# Patient Record
Sex: Male | Born: 1965
Health system: Southern US, Community
[De-identification: ages and names within clinical notes are randomized; demographics above are authoritative.]

## PROBLEM LIST (undated history)

## (undated) DIAGNOSIS — G4733 Obstructive sleep apnea (adult) (pediatric): Secondary | ICD-10-CM

## (undated) DIAGNOSIS — I493 Ventricular premature depolarization: Secondary | ICD-10-CM

## (undated) DIAGNOSIS — I509 Heart failure, unspecified: Secondary | ICD-10-CM

## (undated) DIAGNOSIS — I2510 Atherosclerotic heart disease of native coronary artery without angina pectoris: Secondary | ICD-10-CM

## (undated) DIAGNOSIS — I428 Other cardiomyopathies: Secondary | ICD-10-CM

## (undated) DIAGNOSIS — Z720 Tobacco use: Secondary | ICD-10-CM

## (undated) HISTORY — DX: Other cardiomyopathies: I42.8

## (undated) HISTORY — DX: Ventricular premature depolarization: I49.3

## (undated) HISTORY — PX: APPENDECTOMY: SHX54

## (undated) HISTORY — DX: Atherosclerotic heart disease of native coronary artery without angina pectoris: I25.10

## (undated) HISTORY — DX: Obstructive sleep apnea (adult) (pediatric): G47.33

---

## 2003-11-03 ENCOUNTER — Ambulatory Visit: Admission: RE | Admit: 2003-11-03 | Discharge: 2003-11-03 | Payer: Self-pay | Admitting: Otolaryngology

## 2005-09-14 ENCOUNTER — Emergency Department (HOSPITAL_COMMUNITY): Admission: EM | Admit: 2005-09-14 | Discharge: 2005-09-14 | Payer: Self-pay | Admitting: Emergency Medicine

## 2005-09-18 ENCOUNTER — Ambulatory Visit: Payer: Self-pay | Admitting: Orthopedic Surgery

## 2005-09-19 ENCOUNTER — Ambulatory Visit (HOSPITAL_COMMUNITY): Payer: Self-pay | Admitting: Orthopedic Surgery

## 2005-09-19 ENCOUNTER — Encounter: Admission: RE | Admit: 2005-09-19 | Discharge: 2005-09-19 | Payer: Self-pay | Admitting: Orthopedic Surgery

## 2005-09-19 ENCOUNTER — Encounter (HOSPITAL_COMMUNITY): Admission: RE | Admit: 2005-09-19 | Discharge: 2005-10-19 | Payer: Self-pay | Admitting: Orthopedic Surgery

## 2005-09-25 ENCOUNTER — Ambulatory Visit: Payer: Self-pay | Admitting: Orthopedic Surgery

## 2005-10-02 ENCOUNTER — Ambulatory Visit: Payer: Self-pay | Admitting: Orthopedic Surgery

## 2006-05-27 ENCOUNTER — Encounter: Admission: RE | Admit: 2006-05-27 | Discharge: 2006-05-27 | Payer: Self-pay | Admitting: Otolaryngology

## 2012-12-30 ENCOUNTER — Other Ambulatory Visit (HOSPITAL_COMMUNITY): Payer: Self-pay | Admitting: Internal Medicine

## 2012-12-30 ENCOUNTER — Ambulatory Visit (HOSPITAL_COMMUNITY)
Admission: RE | Admit: 2012-12-30 | Discharge: 2012-12-30 | Disposition: A | Discharge status: Home / Self Care | Payer: BC Managed Care – PPO | Source: Ambulatory Visit | Attending: Internal Medicine | Admitting: Internal Medicine

## 2012-12-30 DIAGNOSIS — F17200 Nicotine dependence, unspecified, uncomplicated: Secondary | ICD-10-CM | POA: Insufficient documentation

## 2012-12-30 DIAGNOSIS — Z0000 Encounter for general adult medical examination without abnormal findings: Secondary | ICD-10-CM

## 2014-06-14 ENCOUNTER — Other Ambulatory Visit (HOSPITAL_COMMUNITY): Payer: Self-pay | Admitting: Physician Assistant

## 2014-06-14 ENCOUNTER — Encounter (INDEPENDENT_AMBULATORY_CARE_PROVIDER_SITE_OTHER): Payer: Self-pay

## 2014-06-14 ENCOUNTER — Ambulatory Visit (HOSPITAL_COMMUNITY)
Admission: RE | Admit: 2014-06-14 | Discharge: 2014-06-14 | Disposition: A | Discharge status: Home / Self Care | Payer: BC Managed Care – PPO | Source: Ambulatory Visit | Attending: Physician Assistant | Admitting: Physician Assistant

## 2014-06-14 DIAGNOSIS — Z0000 Encounter for general adult medical examination without abnormal findings: Secondary | ICD-10-CM

## 2014-10-20 NOTE — H&P (Signed)
  NTS SOAP Note  Vital Signs:  Vitals as of: 6/80/3212: Systolic 248: Diastolic 92: Heart Rate 103: Temp 75F: Height 14ft 2in: Weight 253Lbs 0 Ounces: BMI 32.48  BMI : 32.48 kg/m2  Subjective: This 49 year old male presents for of a screening TCS.  Never has had colonoscopy.  No family h/o colon carcinoma.  No gi complaints.  Review of Symptoms:  Constitutional:unremarkable   Head:unremarkable Eyes:unremarkable   Nose/Mouth/Throat:unremarkable Cardiovascular:  unremarkable Respiratory:unremarkable Gastrointestinal:  unremarkable   Genitourinary:unremarkable   joint pain Skin:unremarkable Hematolgic/Lymphatic:unremarkable   Allergic/Immunologic:unremarkable   Past Medical History:  Reviewed  Past Medical History  Surgical History: none Medical Problems: none Allergies: nkda Medications: none   Social History:Reviewed  Social History  Preferred Language: English Race:  Other Ethnicity: Not Hispanic / Latino Age: 49 year Marital Status:  M Alcohol: no   Smoking Status: Current every day smoker reviewed on 10/19/2014 Started Date:  Packs per day: 1.50 Functional Status reviewed on 10/19/2014 ------------------------------------------------ Bathing: Normal Cooking: Normal Dressing: Normal Driving: Normal Eating: Normal Managing Meds: Normal Oral Care: Normal Shopping: Normal Toileting: Normal Transferring: Normal Walking: Normal Cognitive Status reviewed on 10/19/2014 ------------------------------------------------ Attention: Normal Decision Making: Normal Language: Normal Memory: Normal Motor: Normal Perception: Normal Problem Solving: Normal Visual and Spatial: Normal   Family History:Reviewed  Family Health History Mother, Living; Healthy;  Father, Deceased; Healthy;     Objective Information: General:Well appearing, well nourished in no distress. Heart:RRR, no murmur Lungs:  CTA bilaterally, no wheezes,  rhonchi, rales.  Breathing unlabored. Abdomen:Soft, NT/ND, no HSM, no masses. deferred to procedure  Assessment:Need for screening TCS  Diagnoses: V76.51  Z12.11 Screening for malignant neoplasm of colon (Encounter for screening for malignant neoplasm of colon)  Procedures: 25003 - OFFICE OUTPATIENT NEW 20 MINUTES    Plan:  Scheduled for TCS on 11/14/14.   Patient Education:Alternative treatments to surgery were discussed with patient (and family).  Risks and benefits  of procedure including bleeding and perforation were fully explained to the patient (and family) who gave informed consent. Patient/family questions were addressed.  Follow-up:Pending Surgery

## 2014-11-14 ENCOUNTER — Encounter (HOSPITAL_COMMUNITY)
Admission: RE | Disposition: A | Discharge status: Home / Self Care | Payer: Self-pay | Source: Ambulatory Visit | Attending: General Surgery

## 2014-11-14 ENCOUNTER — Encounter (HOSPITAL_COMMUNITY): Payer: Self-pay | Admitting: *Deleted

## 2014-11-14 ENCOUNTER — Ambulatory Visit (HOSPITAL_COMMUNITY)
Admission: RE | Admit: 2014-11-14 | Discharge: 2014-11-14 | Disposition: A | Discharge status: Home / Self Care | Payer: BLUE CROSS/BLUE SHIELD | Source: Ambulatory Visit | Attending: General Surgery | Admitting: General Surgery

## 2014-11-14 DIAGNOSIS — Z1211 Encounter for screening for malignant neoplasm of colon: Secondary | ICD-10-CM | POA: Insufficient documentation

## 2014-11-14 DIAGNOSIS — D125 Benign neoplasm of sigmoid colon: Secondary | ICD-10-CM | POA: Diagnosis not present

## 2014-11-14 DIAGNOSIS — F17200 Nicotine dependence, unspecified, uncomplicated: Secondary | ICD-10-CM | POA: Diagnosis not present

## 2014-11-14 HISTORY — PX: COLONOSCOPY: SHX5424

## 2014-11-14 SURGERY — COLONOSCOPY
Anesthesia: Moderate Sedation

## 2014-11-14 MED ORDER — MEPERIDINE HCL 50 MG/ML IJ SOLN
INTRAMUSCULAR | Status: AC
  Filled 2014-11-14: qty 1

## 2014-11-14 MED ORDER — SODIUM CHLORIDE 0.9 % IV SOLN: INTRAVENOUS | Status: DC

## 2014-11-14 MED ORDER — MIDAZOLAM HCL 5 MG/5ML IJ SOLN
INTRAMUSCULAR | Status: DC | PRN
  Administered 2014-11-14: 4 mg via INTRAVENOUS

## 2014-11-14 MED ORDER — MIDAZOLAM HCL 5 MG/5ML IJ SOLN
INTRAMUSCULAR | Status: AC
  Filled 2014-11-14: qty 5

## 2014-11-14 MED ORDER — MEPERIDINE HCL 50 MG/ML IJ SOLN
INTRAMUSCULAR | Status: DC | PRN
  Administered 2014-11-14: 50 mg via INTRAVENOUS

## 2014-11-14 MED ORDER — SIMETHICONE 40 MG/0.6ML PO SUSP
ORAL | Status: DC | PRN
  Administered 2014-11-14: 09:00:00

## 2014-11-14 NOTE — Op Note (Signed)
[place], [id number]   COLONOSCOPY PROCEDURE REPORT     EXAM DATE: 11-28-2014  PATIENT NAME:      Logan Kennedy, Logan Kennedy           MR #:      [id number]  BIRTHDATE:       1966/05/20      VISIT #:     [phone]  ATTENDING:     Aviva Signs, MD     STATUS:     outpatient ASSISTANT:  INDICATIONS:  The patient is a 49 yr old male here for a colonoscopy due to average risk patient for colon cancer. PROCEDURE PERFORMED:     Colonoscopy with snare polypectomy MEDICATIONS:     Demerol 50 mg IV and Versed 4 mg IV ESTIMATED BLOOD LOSS:     None  CONSENT: The patient understands the risks and benefits of the procedure and understands that these risks include, but are not limited to: sedation, allergic reaction, infection, perforation and/or bleeding. Alternative means of evaluation and treatment include, among others: physical exam, x-rays, and/or surgical intervention. The patient elects to proceed with this endoscopic procedure.  DESCRIPTION OF PROCEDURE: During intra-op preparation period all mechanical & medical equipment was checked for proper function. Hand hygiene and appropriate measures for infection prevention was taken. After the risks, benefits and alternatives of the procedure were thoroughly explained, Informed consent was verified, confirmed and timeout was successfully executed by the treatment team. A digital exam revealed no abnormalities of the rectum. The [id number] ([id number]) endoscope was introduced through the anus and advanced to the cecum, which was identified by both the appendix and ileocecal valve. adequate (Trilyte was used) The instrument was then slowly withdrawn as the colon was fully examined.Estimated blood loss is zero unless otherwise noted in this procedure report.   COLON FINDINGS: Two semi-pedunculated polyps ranging between 3-24mm in size with friable surfaces were found in the sigmoid colon.   A polypectomy was performed using snare cautery.  The resection was complete, the polyp tissue was completely retrieved and sent to histology.   The examination was otherwise normal. Retroflexed views revealed no abnormalities. The scope was then completely withdrawn from the patient and the procedure terminated. Patient did have episodes of bradycardia when scope advanced.  Did not require atropine. SCOPE WITHDRAWAL TIME: 8    ADVERSE EVENTS:      There were no immediate complications.  IMPRESSIONS:     1.  Two semi-pedunculated polyps ranging between 3-39mm in size were found in the sigmoid colon; polypectomy was performed using snare cautery 2.  The examination was otherwise normal  RECOMMENDATIONS:     1.  Repeat Colonoscopy in 3 years. 2.  Await pathology results RECALL:  _____________________________ Aviva Signs, MD eSigned:  Aviva Signs, MD 11/28/14 9:15 AM   cc:   CPT CODES: ICD CODES:  The ICD and CPT codes recommended by this software are interpretations from the data that the clinical staff has captured with the software.  The verification of the translation of this report to the ICD and CPT codes and modifiers is the sole responsibility of the health care institution and practicing physician where this report was generated.  [employer] will not be held responsible for the validity of the ICD and CPT codes included on this report.  AMA assumes no liability for data contained or not contained herein. CPT is a Designer, television/film set of the [employer].   PATIENT  NAME:  Mikaeel, Petrow MR#: [id number]

## 2014-11-14 NOTE — Discharge Instructions (Signed)
Colon Polyps Polyps are lumps of extra tissue growing inside the body. Polyps can grow in the large intestine (colon). Most colon polyps are noncancerous (benign). However, some colon polyps can become cancerous over time. Polyps that are larger than a pea may be harmful. To be safe, caregivers remove and test all polyps. CAUSES  Polyps form when mutations in the genes cause your cells to grow and divide even though no more tissue is needed. RISK FACTORS There are a number of risk factors that can increase your chances of getting colon polyps. They include:  Being older than 50 years.  Family history of colon polyps or colon cancer.  Long-term colon diseases, such as colitis or Crohn disease.  Being overweight.  Smoking.  Being inactive.  Drinking too much alcohol. SYMPTOMS  Most small polyps do not cause symptoms. If symptoms are present, they may include:  Blood in the stool. The stool may look dark red or black.  Constipation or diarrhea that lasts longer than 1 week. DIAGNOSIS People often do not know they have polyps until their caregiver finds them during a regular checkup. Your caregiver can use 4 tests to check for polyps:  Digital rectal exam. The caregiver wears gloves and feels inside the rectum. This test would find polyps only in the rectum.  Barium enema. The caregiver puts a liquid called barium into your rectum before taking X-rays of your colon. Barium makes your colon look white. Polyps are dark, so they are easy to see in the X-ray pictures.  Sigmoidoscopy. A thin, flexible tube (sigmoidoscope) is placed into your rectum. The sigmoidoscope has a light and tiny camera in it. The caregiver uses the sigmoidoscope to look at the last third of your colon.  Colonoscopy. This test is like sigmoidoscopy, but the caregiver looks at the entire colon. This is the most common method for finding and removing polyps. TREATMENT  Any polyps will be removed during a  sigmoidoscopy or colonoscopy. The polyps are then tested for cancer. PREVENTION  To help lower your risk of getting more colon polyps:  Eat plenty of fruits and vegetables. Avoid eating fatty foods.  Do not smoke.  Avoid drinking alcohol.  Exercise every day.  Lose weight if recommended by your caregiver.  Eat plenty of calcium and folate. Foods that are rich in calcium include milk, cheese, and broccoli. Foods that are rich in folate include chickpeas, kidney beans, and spinach. HOME CARE INSTRUCTIONS Keep all follow-up appointments as directed by your caregiver. You may need periodic exams to check for polyps. SEEK MEDICAL CARE IF: You notice bleeding during a bowel movement. Document Released: 04/16/2004 Document Revised: 10/13/2011 Document Reviewed: 09/30/2011 [facility] Patient Information 2015 [place], [place]. This information is not intended to replace advice given to you by your health care provider. Make sure you discuss any questions you have with your health care provider. Colonoscopy, Care After Refer to this sheet in the next few weeks. These instructions provide you with information on caring for yourself after your procedure. Your health care provider may also give you more specific instructions. Your treatment has been planned according to current medical practices, but problems sometimes occur. Call your health care provider if you have any problems or questions after your procedure. WHAT TO EXPECT AFTER THE PROCEDURE  After your procedure, it is typical to have the following:  A small amount of blood in your stool.  Moderate amounts of gas and mild abdominal cramping or bloating. [place]  not drive, operate machinery, or sign important documents for 24 hours.  You may shower and resume your regular physical activities, but move at a slower pace for the first 24 hours.  Take frequent rest periods for the first 24 hours.  Walk around or put a warm  pack on your abdomen to help reduce abdominal cramping and bloating.  Drink enough fluids to keep your urine clear or pale yellow.  You may resume your normal diet as instructed by your health care provider. Avoid heavy or fried foods that are hard to digest.  Avoid drinking alcohol for 24 hours or as instructed by your health care provider.  Only take over-the-counter or prescription medicines as directed by your health care provider.  If a tissue sample (biopsy) was taken during your procedure:  Do not take aspirin or blood thinners for 7 days, or as instructed by your health care provider.  Do not drink alcohol for 7 days, or as instructed by your health care provider.  Eat soft foods for the first 24 hours. SEEK MEDICAL CARE IF: You have persistent spotting of blood in your stool 2-3 days after the procedure. SEEK IMMEDIATE MEDICAL CARE IF:  You have more than a small spotting of blood in your stool.  You pass large blood clots in your stool.  Your abdomen is swollen (distended).  You have nausea or vomiting.  You have a fever.  You have increasing abdominal pain that is not relieved with medicine. Document Released: 03/04/2004 Document Revised: 05/11/2013 Document Reviewed: 03/28/2013 [facility] Patient Information 2015 [place], [place]. This information is not intended to replace advice given to you by your health care provider. Make sure you discuss any questions you have with your health care provider.   Colonoscopy, Care After These instructions give you information on caring for yourself after your procedure. Your doctor may also give you more specific instructions. Call your doctor if you have any problems or questions after your procedure. HOME CARE  Do not drive for 24 hours.  Do not sign important papers or use machinery for 24 hours.  You may shower.  You may go back to your usual activities, but go slower for the first 24 hours.  Take rest breaks often  during the first 24 hours.  Walk around or use warm packs on your belly (abdomen) if you have belly cramping or gas.  Drink enough fluids to keep your pee (urine) clear or pale yellow.  Resume your normal diet. Avoid heavy or fried foods.  Avoid drinking alcohol for 24 hours or as told by your doctor.  Only take medicines as told by your doctor. If a tissue sample (biopsy) was taken during the procedure:   Do not take aspirin or blood thinners for 7 days, or as told by your doctor.  Do not drink alcohol for 7 days, or as told by your doctor.  Eat soft foods for the first 24 hours. GET HELP IF: You still have a small amount of blood in your poop (stool) 2-3 days after the procedure. GET HELP RIGHT AWAY IF:  You have more than a small amount of blood in your poop.  You see clumps of tissue (blood clots) in your poop.  Your belly is puffy (swollen).  You feel sick to your stomach (nauseous) or throw up (vomit).  You have a fever.  You have belly pain that gets worse and medicine does not help. MAKE SURE YOU:  Understand these instructions.  Will watch  your condition.  Will get help right away if you are not doing well or get worse. Document Released: 08/23/2010 Document Revised: 07/26/2013 Document Reviewed: 03/28/2013 [facility] Patient Information 2015 [place], [place]. This information is not intended to replace advice given to you by your health care provider. Make sure you discuss any questions you have with your health care provider.

## 2014-11-14 NOTE — Interval H&P Note (Signed)
History and Physical Interval Note:  11/14/2014 8:50 AM  Logan Kennedy  has presented today for surgery, with the diagnosis of screening  The various methods of treatment have been discussed with the patient and family. After consideration of risks, benefits and other options for treatment, the patient has consented to  Procedure(s): COLONOSCOPY (N/A) as a surgical intervention .  The patient's history has been reviewed, patient examined, no change in status, stable for surgery.  I have reviewed the patient's chart and labs.  Questions were answered to the patient's satisfaction.     Aviva Signs A

## 2014-11-17 ENCOUNTER — Encounter (HOSPITAL_COMMUNITY): Payer: Self-pay | Admitting: General Surgery

## 2016-08-18 ENCOUNTER — Ambulatory Visit (HOSPITAL_COMMUNITY)
Admission: RE | Admit: 2016-08-18 | Discharge: 2016-08-18 | Disposition: A | Discharge status: Home / Self Care | Payer: 59 | Source: Ambulatory Visit | Attending: Registered Nurse | Admitting: Registered Nurse

## 2016-08-18 ENCOUNTER — Other Ambulatory Visit (HOSPITAL_COMMUNITY): Payer: Self-pay | Admitting: Registered Nurse

## 2016-08-18 DIAGNOSIS — R059 Cough, unspecified: Secondary | ICD-10-CM

## 2016-08-18 DIAGNOSIS — R05 Cough: Secondary | ICD-10-CM | POA: Diagnosis not present

## 2019-05-09 ENCOUNTER — Other Ambulatory Visit: Payer: Self-pay

## 2019-05-09 ENCOUNTER — Other Ambulatory Visit (HOSPITAL_COMMUNITY): Payer: Self-pay | Admitting: Internal Medicine

## 2019-05-09 ENCOUNTER — Ambulatory Visit (HOSPITAL_COMMUNITY)
Admission: RE | Admit: 2019-05-09 | Discharge: 2019-05-09 | Disposition: A | Discharge status: Home / Self Care | Payer: Commercial Managed Care - PPO | Source: Ambulatory Visit | Attending: Internal Medicine | Admitting: Internal Medicine

## 2019-05-09 DIAGNOSIS — R1013 Epigastric pain: Secondary | ICD-10-CM | POA: Insufficient documentation

## 2019-05-16 ENCOUNTER — Encounter (HOSPITAL_COMMUNITY): Payer: Self-pay | Admitting: Emergency Medicine

## 2019-05-16 ENCOUNTER — Emergency Department (HOSPITAL_COMMUNITY): Payer: Commercial Managed Care - PPO

## 2019-05-16 ENCOUNTER — Inpatient Hospital Stay (HOSPITAL_COMMUNITY)
Admission: EM | Admit: 2019-05-16 | Discharge: 2019-05-19 | DRG: 291 | Disposition: A | Discharge status: Home / Self Care | Payer: Commercial Managed Care - PPO | Attending: Internal Medicine | Admitting: Internal Medicine

## 2019-05-16 ENCOUNTER — Other Ambulatory Visit: Payer: Self-pay | Admitting: Internal Medicine

## 2019-05-16 ENCOUNTER — Other Ambulatory Visit: Payer: Self-pay

## 2019-05-16 ENCOUNTER — Other Ambulatory Visit (HOSPITAL_COMMUNITY): Payer: Self-pay | Admitting: Internal Medicine

## 2019-05-16 DIAGNOSIS — Z716 Tobacco abuse counseling: Secondary | ICD-10-CM

## 2019-05-16 DIAGNOSIS — I248 Other forms of acute ischemic heart disease: Secondary | ICD-10-CM | POA: Diagnosis present

## 2019-05-16 DIAGNOSIS — Z825 Family history of asthma and other chronic lower respiratory diseases: Secondary | ICD-10-CM

## 2019-05-16 DIAGNOSIS — F17210 Nicotine dependence, cigarettes, uncomplicated: Secondary | ICD-10-CM | POA: Diagnosis present

## 2019-05-16 DIAGNOSIS — Z20828 Contact with and (suspected) exposure to other viral communicable diseases: Secondary | ICD-10-CM | POA: Diagnosis present

## 2019-05-16 DIAGNOSIS — K761 Chronic passive congestion of liver: Secondary | ICD-10-CM | POA: Diagnosis present

## 2019-05-16 DIAGNOSIS — J181 Lobar pneumonia, unspecified organism: Secondary | ICD-10-CM | POA: Diagnosis present

## 2019-05-16 DIAGNOSIS — I5021 Acute systolic (congestive) heart failure: Secondary | ICD-10-CM | POA: Diagnosis present

## 2019-05-16 DIAGNOSIS — I361 Nonrheumatic tricuspid (valve) insufficiency: Secondary | ICD-10-CM | POA: Diagnosis not present

## 2019-05-16 DIAGNOSIS — R7989 Other specified abnormal findings of blood chemistry: Secondary | ICD-10-CM | POA: Diagnosis not present

## 2019-05-16 DIAGNOSIS — I509 Heart failure, unspecified: Secondary | ICD-10-CM | POA: Diagnosis not present

## 2019-05-16 DIAGNOSIS — K769 Liver disease, unspecified: Secondary | ICD-10-CM | POA: Diagnosis present

## 2019-05-16 DIAGNOSIS — I5020 Unspecified systolic (congestive) heart failure: Secondary | ICD-10-CM

## 2019-05-16 DIAGNOSIS — R109 Unspecified abdominal pain: Secondary | ICD-10-CM

## 2019-05-16 DIAGNOSIS — R944 Abnormal results of kidney function studies: Secondary | ICD-10-CM | POA: Diagnosis present

## 2019-05-16 DIAGNOSIS — I160 Hypertensive urgency: Secondary | ICD-10-CM | POA: Diagnosis not present

## 2019-05-16 DIAGNOSIS — R188 Other ascites: Secondary | ICD-10-CM | POA: Diagnosis present

## 2019-05-16 DIAGNOSIS — I5041 Acute combined systolic (congestive) and diastolic (congestive) heart failure: Secondary | ICD-10-CM | POA: Diagnosis not present

## 2019-05-16 DIAGNOSIS — J189 Pneumonia, unspecified organism: Secondary | ICD-10-CM | POA: Diagnosis not present

## 2019-05-16 DIAGNOSIS — I428 Other cardiomyopathies: Secondary | ICD-10-CM | POA: Diagnosis present

## 2019-05-16 DIAGNOSIS — Z720 Tobacco use: Secondary | ICD-10-CM

## 2019-05-16 DIAGNOSIS — R7401 Elevation of levels of liver transaminase levels: Secondary | ICD-10-CM | POA: Diagnosis not present

## 2019-05-16 DIAGNOSIS — I429 Cardiomyopathy, unspecified: Secondary | ICD-10-CM | POA: Diagnosis not present

## 2019-05-16 DIAGNOSIS — Z8249 Family history of ischemic heart disease and other diseases of the circulatory system: Secondary | ICD-10-CM | POA: Diagnosis not present

## 2019-05-16 DIAGNOSIS — I110 Hypertensive heart disease with heart failure: Secondary | ICD-10-CM | POA: Diagnosis not present

## 2019-05-16 DIAGNOSIS — K760 Fatty (change of) liver, not elsewhere classified: Secondary | ICD-10-CM | POA: Diagnosis present

## 2019-05-16 DIAGNOSIS — I083 Combined rheumatic disorders of mitral, aortic and tricuspid valves: Secondary | ICD-10-CM | POA: Diagnosis not present

## 2019-05-16 DIAGNOSIS — I5031 Acute diastolic (congestive) heart failure: Secondary | ICD-10-CM | POA: Diagnosis present

## 2019-05-16 DIAGNOSIS — I420 Dilated cardiomyopathy: Secondary | ICD-10-CM | POA: Diagnosis not present

## 2019-05-16 DIAGNOSIS — I340 Nonrheumatic mitral (valve) insufficiency: Secondary | ICD-10-CM | POA: Diagnosis not present

## 2019-05-16 HISTORY — DX: Tobacco use: Z72.0

## 2019-05-16 LAB — COMPREHENSIVE METABOLIC PANEL
ALT: 68 U/L — ABNORMAL HIGH (ref 0–44)
AST: 52 U/L — ABNORMAL HIGH (ref 15–41)
Albumin: 3.8 g/dL (ref 3.5–5.0)
Alkaline Phosphatase: 64 U/L (ref 38–126)
Anion gap: 8 (ref 5–15)
BUN: 27 mg/dL — ABNORMAL HIGH (ref 6–20)
CO2: 26 mmol/L (ref 22–32)
Calcium: 8.6 mg/dL — ABNORMAL LOW (ref 8.9–10.3)
Chloride: 104 mmol/L (ref 98–111)
Creatinine, Ser: 0.97 mg/dL (ref 0.61–1.24)
GFR calc Af Amer: >60 mL/min (ref >60)
GFR calc non Af Amer: >60 mL/min (ref >60)
Glucose, Bld: 101 mg/dL — ABNORMAL HIGH (ref 70–99)
Potassium: 4.1 mmol/L (ref 3.5–5.1)
Sodium: 138 mmol/L (ref 135–145)
Total Bilirubin: 0.8 mg/dL (ref 0.3–1.2)
Total Protein: 7 g/dL (ref 6.5–8.1)

## 2019-05-16 LAB — TROPONIN I (HIGH SENSITIVITY): Troponin I (High Sensitivity): 63 ng/L — ABNORMAL HIGH (ref <18)

## 2019-05-16 LAB — URINALYSIS, ROUTINE W REFLEX MICROSCOPIC
Bacteria, UA: NONE SEEN
Bilirubin Urine: NEGATIVE
Glucose, UA: NEGATIVE mg/dL
Hgb urine dipstick: NEGATIVE
Ketones, ur: 5 mg/dL — AB
Leukocytes,Ua: NEGATIVE
Nitrite: NEGATIVE
Protein, ur: 100 mg/dL — AB
Specific Gravity, Urine: 1.029 (ref 1.005–1.030)
pH: 5 (ref 5.0–8.0)

## 2019-05-16 LAB — CBC
HCT: 46.2 % (ref 39.0–52.0)
Hemoglobin: 14.1 g/dL (ref 13.0–17.0)
MCH: 29.6 pg (ref 26.0–34.0)
MCHC: 30.5 g/dL (ref 30.0–36.0)
MCV: 96.9 fL (ref 80.0–100.0)
Platelets: 289 10*3/uL (ref 150–400)
RBC: 4.77 MIL/uL (ref 4.22–5.81)
RDW: 13.9 % (ref 11.5–15.5)
WBC: 11.8 10*3/uL — ABNORMAL HIGH (ref 4.0–10.5)
nRBC: 0 % (ref 0.0–0.2)

## 2019-05-16 LAB — LIPASE, BLOOD: Lipase: 51 U/L (ref 11–51)

## 2019-05-16 LAB — BRAIN NATRIURETIC PEPTIDE: B Natriuretic Peptide: 607 pg/mL — ABNORMAL HIGH (ref 0.0–100.0)

## 2019-05-16 MED ORDER — FUROSEMIDE 10 MG/ML IJ SOLN
40.0000 mg | Freq: Once | INTRAMUSCULAR | Status: AC
  Administered 2019-05-16: 40 mg via INTRAVENOUS
  Filled 2019-05-16: qty 4

## 2019-05-16 MED ORDER — HYDRALAZINE HCL 20 MG/ML IJ SOLN: 10.0000 mg | INTRAMUSCULAR | Status: DC | PRN

## 2019-05-16 MED ORDER — IOHEXOL 300 MG/ML  SOLN
100.0000 mL | Freq: Once | INTRAMUSCULAR | Status: AC | PRN
  Administered 2019-05-16: 100 mL via INTRAVENOUS

## 2019-05-16 NOTE — ED Provider Notes (Signed)
Patient with congestive heart failure.  He will be admitted for diuresis and further evaluation   Milton Ferguson, MD 05/16/19 [id number]

## 2019-05-16 NOTE — ED Provider Notes (Signed)
[facility] EMERGENCY DEPARTMENT Provider Note   CSN: [id number] Arrival date & time: 05/16/19  1544     History   Chief Complaint Chief Complaint  Patient presents with   Bloated    HPI Logan Kennedy is a 53 y.o. male with a hx of tobacco abuse & prior appendectomy who presents to the ED with complaints of abdominal bloating x 1 month. Patient states his abdomen intermittently becomes bloated w/ distension & tightness, at times this is associated with pain. These seems to be progressively worsening. When it gets really bad he states he almost feels short of breath because it feels so tight. He drank some water with vinegar yesterday and this seemed to help, no other alleviating/aggravating factors. Denies fever, chills, n/v/d, melena, hematochezia, or constipation. States he has seen PCP for this, they did xray and found pneumonia which he is being treated for with abx which he is taking as prescribed, there was plan for CT A/P but he thinks they couldn't get this approved. He denies and recent lifestyle/diet changes. States he drinks EtOH occasionally, not daily.     HPI  History reviewed. No pertinent past medical history.  There are no active problems to display for this patient.   Past Surgical History:  Procedure Laterality Date   APPENDECTOMY     COLONOSCOPY N/A 11/14/2014   Procedure: COLONOSCOPY;  Surgeon: Aviva Signs Md, MD;  Location: [facility];  Service: Gastroenterology;  Laterality: N/A;        Home Medications    Prior to Admission medications   Medication Sig Start Date End Date Taking? Authorizing Provider  Multiple Vitamins-Minerals (MULTIVITAMINS THER. W/MINERALS) TABS tablet Take 1 tablet by mouth.    [provider]    Family History Family History  Problem Relation Age of Onset   COPD Father     Social History Social History   Tobacco Use   Smoking status: Current Every Day Smoker    Packs/day: 1.50    Years: 33.00   Pack years: 49.50   Smokeless tobacco: Never Used  Substance Use Topics   Alcohol use: No   Drug use: No     Allergies   Patient has no known allergies.   Review of Systems Review of Systems  Constitutional: Negative for chills and fever.  Respiratory: Positive for shortness of breath (w/ bad pain/swelling).   Cardiovascular: Negative for chest pain and leg swelling.  Gastrointestinal: Positive for abdominal distention and abdominal pain. Negative for blood in stool, constipation, diarrhea, nausea and vomiting.  Genitourinary: Negative for dysuria, penile swelling and scrotal swelling.  Neurological: Negative for syncope.  All other systems reviewed and are negative.    Physical Exam Updated Vital Signs BP (!) 135/102 (BP Location: Left Arm) Comment: told rn bp up   Pulse (!) 52    Temp 98.3 F (36.8 C) (Oral)    Resp (!) 24    Ht 6\' 2"  (1.88 m)    Wt 116.6 kg    SpO2 100%    BMI 33.00 kg/m   Physical Exam Vitals signs and nursing note reviewed.  Constitutional:      General: He is not in acute distress.    Appearance: He is well-developed. He is not toxic-appearing.  HENT:     Head: Normocephalic and atraumatic.  Eyes:     General:        Right eye: No discharge.        Left eye: No discharge.  Conjunctiva/sclera: Conjunctivae normal.  Neck:     Musculoskeletal: Neck supple.  Cardiovascular:     Rate and Rhythm: Regular rhythm. Bradycardia present.  Pulmonary:     Effort: Pulmonary effort is normal. No respiratory distress.     Breath sounds: Normal breath sounds. No wheezing, rhonchi or rales.  Abdominal:     General: There is distension (mild).     Palpations: Abdomen is soft.     Tenderness: There is no abdominal tenderness. There is no guarding or rebound.  Musculoskeletal:     Comments: 1+ symmetric pitting edema to BLEs w/o overlying erythema/warmth or tenderness to palpation.   Skin:    General: Skin is warm and dry.     Findings: No rash.    Neurological:     Mental Status: He is alert.     Comments: Clear speech.   Psychiatric:        Behavior: Behavior normal.    ED Treatments / Results  Labs (all labs ordered are listed, but only abnormal results are displayed) Labs Reviewed  COMPREHENSIVE METABOLIC PANEL - Abnormal; Notable for the following components:      Result Value   Glucose, Bld 101 (*)    BUN 27 (*)    Calcium 8.6 (*)    AST 52 (*)    ALT 68 (*)    All other components within normal limits  CBC - Abnormal; Notable for the following components:   WBC 11.8 (*)    All other components within normal limits  URINALYSIS, ROUTINE W REFLEX MICROSCOPIC - Abnormal; Notable for the following components:   APPearance HAZY (*)    Ketones, ur 5 (*)    Protein, ur 100 (*)    All other components within normal limits  LIPASE, BLOOD    EKG None  Radiology Ct Abdomen Pelvis W Contrast  Result Date: 05/16/2019 CLINICAL DATA:  53 year old male with abdominal pain and bloating. EXAM: CT ABDOMEN AND PELVIS WITH CONTRAST TECHNIQUE: Multidetector CT imaging of the abdomen and pelvis was performed using the standard protocol following bolus administration of intravenous contrast. CONTRAST:  161mL OMNIPAQUE IOHEXOL 300 MG/ML  SOLN COMPARISON:  None. FINDINGS: Lower chest: Partially visualized moderate right pleural effusion with associated partial compressive atelectasis of the right lung base. Pneumonia is not excluded. Clinical correlation is recommended. There is mild cardiomegaly. Partially visualized small pericardial effusion measuring approximately 10 mm in thickness. There is consolidative changes of the visualized right middle lobe. No intra-abdominal free air. Small free fluid within the pelvis. Hepatobiliary: Apparent fatty infiltration of the liver. No intrahepatic biliary ductal dilatation. There is no calcified gallstone. There is diffuse thickening of the gallbladder wall with pericholecystic stranding and trace  fluid. This may be related to underlying liver disease. Ultrasound may provide better evaluation of the gallbladder. Correlation with liver function test recommended. Pancreas: Unremarkable. No pancreatic ductal dilatation or surrounding inflammatory changes. Spleen: Normal in size without focal abnormality. Adrenals/Urinary Tract: Left adrenal thickening/hyperplasia. The right adrenal gland is grossly unremarkable. There is a 2 mm nonobstructing left renal inferior pole calculus. No hydronephrosis. There is no hydronephrosis or nephrolithiasis on the right. There is symmetric enhancement and excretion of contrast by both kidneys. The visualized ureters and urinary bladder appear unremarkable. Stomach/Bowel: Mild thickened appearance of the distal stomach, likely related to underdistention. Slight haziness of the fat surrounding the duodenal C-loop, likely related to underlying liver disease. Clinical correlation is recommended to exclude duodenitis. There is no bowel obstruction. Appendectomy. Vascular/Lymphatic: Mild  aortoiliac atherosclerotic disease. The IVC is unremarkable. No portal venous gas. There is no adenopathy. Reproductive: The prostate and seminal vesicles are grossly unremarkable. No pelvic mass. Other: Small fat containing umbilical hernia. Musculoskeletal: No acute or significant osseous findings. IMPRESSION: 1. Fatty liver with findings suggestive of underlying liver disease or steatohepatitis. Correlation with LFTs recommended. 2. Thickened appearance of the gallbladder wall likely related to underlying liver disease. Ultrasound may provide better evaluation of the gallbladder. 3. Mild haziness of the fat surrounding the duodenum, likely related to hepatic enteropathy. Clinical correlation is recommended to exclude duodenitis. No bowel obstruction. 4. Partially visualized moderate right pleural effusion with associated partial compressive atelectasis of the right lung base. Pneumonia is not  excluded. Clinical correlation is recommended. 5. Aortic Atherosclerosis (ICD10-I70.0). Electronically Signed   By: Anner Crete M.D.   On: 05/16/2019 21:50   Dg Chest Portable 1 View  Result Date: 05/16/2019 CLINICAL DATA:  Shortness of breath EXAM: PORTABLE CHEST 1 VIEW COMPARISON:  August 18, 2016. FINDINGS: There is airspace opacity in the right lower lung region. The lungs elsewhere are clear. Heart is mildly enlarged with pulmonary vascularity within normal limits. No adenopathy. No bone lesions. IMPRESSION: Airspace opacity consistent with pneumonia right lower lung region. Lungs elsewhere clear. Mild cardiac prominence. No adenopathy evident. Followup PA and lateral chest radiographs recommended in 3-4 weeks following trial of antibiotic therapy to ensure resolution and exclude underlying malignancy. Electronically Signed   By: Lowella Grip III M.D.   On: 05/16/2019 22:00    Procedures Procedures (including critical care time)  Medications Ordered in ED Medications - No data to display   Initial Impression / Assessment and Plan / ED Course  I have reviewed the triage vital signs and the nursing notes.  Pertinent labs & imaging results that were available during my care of the patient were reviewed by me and considered in my medical decision making (see chart for details).   Patient presents to the ED w/ complaints of intermittent abdominal bloating/discomfort x 1 month. Nontoxic appearing, resting comfortably, mild bradycardia & elevated BP- doubt HTN emergency. Exam w/ some mild abdominal distension noted. No tenderness or peritoneal signs. 1+ symmetric lower leg edema.   Labs per triage reviewed:  CBC: Mild leukocytosis @ 11.8. No anemia.  CMP: Mildly elevated LFTs w/ AST 52 & ALT 68- no old on record for comparison. Renal function WNL. Mild hypocalcemia no significant electrolyte derangement. Albumin WNL.  Lipase: WNL UA: No UTI, mild ketones/protein- PCP recheck.    Plan for CT A/P & CXR.  CT: Fatty liver findings with several other additional findings as detailed above the are likely related to liver disease. No focal RUQ tenderness, negative murphys- do not suspect acute cholecystitis. Duodenitis felt to be less likely without N/V/D or tenderness to palpation on abdominal exam. Regarding right pleural effusion w/ pneumonia not excluded- currently receiving tx for pneumonia.   CXR: Airspace opacity consistent with pneumonia right lower lung region. Lungs elsewhere clear. Mild cardiac prominence. No adenopathy evident. Followup PA and lateral chest radiographs recommended in 3-4 weeks following trial of antibiotic therapy to ensure resolution and exclude underlying malignancy. --> currently on abx for pneumonia by PCP.   Work-up with findings of liver disease, there is some cardiac prominence on CXR & he does have peripheral edema- discussed w/ Dr. Roderic Palau who has evaluated patient- will add on EKG, BNP, & troponin for further assessment of possible CHF origin.   22:05: Patient care signed out to  Dr. Roderic Palau pending additional work-up & disposition.    Final Clinical Impressions(s) / ED Diagnoses   Final diagnoses:  None    ED Discharge Orders    None       Amaryllis Dyke, PA-C 05/16/19 2206    Milton Ferguson, MD 05/21/19 1013

## 2019-05-16 NOTE — ED Notes (Signed)
Gave EKG to Dr. Zammit 

## 2019-05-16 NOTE — H&P (Addendum)
History and Physical    OSHA KEMPNER [id number] DOB: [birth year] DOA: 05/16/2019  PCP: Redmond School, MD   Patient coming from: Home   Chief Complaint: Abdominal bloating, productive cough, leg swelling, orthopnea    HPI: Logan Kennedy is a 53 y.o. male with medical history significant for tobacco abuse, has recently seen a doctor, and now presents for evaluation of abdominal bloating and discomfort, leg swelling, and productive cough.  Patient reports approximately a month of frequent abdominal distention with some mild discomfort.  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  He also noted progressive dyspnea and productive cough recently, was evaluated with a chest x-ray 1 week ago, diagnosed with pneumonia, and started on Augmentin.  He continues to have shortness of breath and productive cough, but has not noted any fevers or chills and denies any chest pain or palpitations.  Reports that he has been cutting back on his smoking recently, now down to a couple cigarettes daily from 1.5 pack per day.   ED Course: Upon arrival to the ED, patient is found to be afebrile, saturating well on room air, tachypneic in the 20s, tachycardic in the 110s, and with diastolic blood pressure just over 100.  EKG features a sinus rhythm with PVC and anterior Q waves.  Chest x-ray concerning for right lower lobe pneumonia.  CT of the abdomen and pelvis notable for fatty liver, questionable underlying liver disease or steatosis, thickened gallbladder likely related to liver disease, and haziness about the duodenum likely reflecting hepatic enteropathy.  Chemistry panel with elevated BUN to creatinine ratio, mild elevation in transaminases.  CBC with leukocytosis to 11,800.  High-sensitivity troponin is elevated to 63 and BNP is elevated to 607.  Patient was given 40 mg IV Lasix in the ED.  Review of Systems:  All  other systems reviewed and apart from HPI, are negative.  History reviewed. No pertinent past medical history.  Past Surgical History:  Procedure Laterality Date  . APPENDECTOMY    . COLONOSCOPY N/A 11/14/2014   Procedure: COLONOSCOPY;  Surgeon: Aviva Signs Md, MD;  Location: [facility];  Service: Gastroenterology;  Laterality: N/A;     reports that he has been smoking. He has a 49.50 pack-year smoking history. He has never used smokeless tobacco. He reports that he does not drink alcohol or use drugs.  No Known Allergies  Family History  Problem Relation Age of Onset  . COPD Father      Prior to Admission medications   Medication Sig Start Date End Date Taking? Authorizing Provider  amoxicillin-clavulanate (AUGMENTIN) 875-125 MG tablet Take 1 tablet by mouth 2 (two) times daily. 10 day course starting on 05/10/2019 05/10/19  Yes [provider]  mometasone (NASONEX) 50 MCG/ACT nasal spray Place 2 sprays into the nose daily.  05/10/19  Yes [provider]  Multiple Vitamins-Minerals (MULTIVITAMINS THER. W/MINERALS) TABS tablet Take 1 tablet by mouth.   Yes [provider]    Physical Exam: Vitals:   05/16/19 1619  BP: (!) 135/102  Pulse: (!) 52  Resp: (!) 24  Temp: 98.3 F (36.8 C)  TempSrc: Oral  SpO2: 100%  Weight: 116.6 kg  Height: 6\' 2"  (1.88 m)    Constitutional: NAD, calm  Eyes: PERTLA, lids and conjunctivae normal ENMT: Mucous membranes are moist. Posterior pharynx clear of any exudate or lesions.   Neck: normal, supple,  no masses, no thyromegaly Respiratory: no wheezing, no crackles. No accessory muscle use.  Cardiovascular: S1 & S2 heard, regular rate and rhythm. Pretibial pitting edema bilaterally. Abdomen: Soft, no tenderness. Bowel sounds active.  Musculoskeletal: no clubbing / cyanosis. No joint deformity upper and lower extremities.    Skin: no significant rashes, lesions, ulcers. Warm, dry, well-perfused. Neurologic: CN  2-12 grossly intact. Sensation intact. Strength 5/5 in all 4 limbs.  Psychiatric: Alert and oriented x 3. Pleasant, cooperative.    Labs on Admission: I have personally reviewed following labs and imaging studies  CBC: Recent Labs  Lab 05/16/19 1811  WBC 11.8*  HGB 14.1  HCT 46.2  MCV 96.9  PLT [id number]   Basic Metabolic Panel: Recent Labs  Lab 05/16/19 1811  NA 138  K 4.1  CL 104  CO2 26  GLUCOSE 101*  BUN 27*  CREATININE 0.97  CALCIUM 8.6*   GFR: Estimated Creatinine Clearance: 116.8 mL/min (by C-G formula based on SCr of 0.97 mg/dL). Liver Function Tests: Recent Labs  Lab 05/16/19 1811  AST 52*  ALT 68*  ALKPHOS 64  BILITOT 0.8  PROT 7.0  ALBUMIN 3.8   Recent Labs  Lab 05/16/19 1811  LIPASE 51   No results for input(s): AMMONIA in the last 168 hours. Coagulation Profile: No results for input(s): INR, PROTIME in the last 168 hours. Cardiac Enzymes: No results for input(s): CKTOTAL, CKMB, CKMBINDEX, TROPONINI in the last 168 hours. BNP (last 3 results) No results for input(s): PROBNP in the last 8760 hours. HbA1C: No results for input(s): HGBA1C in the last 72 hours. CBG: No results for input(s): GLUCAP in the last 168 hours. Lipid Profile: No results for input(s): CHOL, HDL, LDLCALC, TRIG, CHOLHDL, LDLDIRECT in the last 72 hours. Thyroid Function Tests: No results for input(s): TSH, T4TOTAL, FREET4, T3FREE, THYROIDAB in the last 72 hours. Anemia Panel: No results for input(s): VITAMINB12, FOLATE, FERRITIN, TIBC, IRON, RETICCTPCT in the last 72 hours. Urine analysis:    Component Value Date/Time   COLORURINE YELLOW 05/16/2019 2028   APPEARANCEUR HAZY (A) 05/16/2019 2028   LABSPEC 1.029 05/16/2019 2028   PHURINE 5.0 05/16/2019 2028   GLUCOSEU NEGATIVE 05/16/2019 2028   HGBUR NEGATIVE 05/16/2019 2028   [place] 05/16/2019 2028   KETONESUR 5 (A) 05/16/2019 2028   PROTEINUR 100 (A) 05/16/2019 2028   NITRITE NEGATIVE 05/16/2019 2028    LEUKOCYTESUR NEGATIVE 05/16/2019 2028   Sepsis Labs: @LABRCNTIP (procalcitonin:4,lacticidven:4) )No results found for this or any previous visit (from the past 240 hour(s)).   Radiological Exams on Admission: Ct Abdomen Pelvis W Contrast  Result Date: 05/16/2019 CLINICAL DATA:  53 year old male with abdominal pain and bloating. EXAM: CT ABDOMEN AND PELVIS WITH CONTRAST TECHNIQUE: Multidetector CT imaging of the abdomen and pelvis was performed using the standard protocol following bolus administration of intravenous contrast. CONTRAST:  122mL OMNIPAQUE IOHEXOL 300 MG/ML  SOLN COMPARISON:  None. FINDINGS: Lower chest: Partially visualized moderate right pleural effusion with associated partial compressive atelectasis of the right lung base. Pneumonia is not excluded. Clinical correlation is recommended. There is mild cardiomegaly. Partially visualized small pericardial effusion measuring approximately 10 mm in thickness. There is consolidative changes of the visualized right middle lobe. No intra-abdominal free air. Small free fluid within the pelvis. Hepatobiliary: Apparent fatty infiltration of the liver. No intrahepatic biliary ductal dilatation. There is no calcified gallstone. There is diffuse thickening of the gallbladder wall with pericholecystic stranding and trace fluid. This may be related to underlying liver disease.  Ultrasound may provide better evaluation of the gallbladder. Correlation with liver function test recommended. Pancreas: Unremarkable. No pancreatic ductal dilatation or surrounding inflammatory changes. Spleen: Normal in size without focal abnormality. Adrenals/Urinary Tract: Left adrenal thickening/hyperplasia. The right adrenal gland is grossly unremarkable. There is a 2 mm nonobstructing left renal inferior pole calculus. No hydronephrosis. There is no hydronephrosis or nephrolithiasis on the right. There is symmetric enhancement and excretion of contrast by both kidneys. The  visualized ureters and urinary bladder appear unremarkable. Stomach/Bowel: Mild thickened appearance of the distal stomach, likely related to underdistention. Slight haziness of the fat surrounding the duodenal C-loop, likely related to underlying liver disease. Clinical correlation is recommended to exclude duodenitis. There is no bowel obstruction. Appendectomy. Vascular/Lymphatic: Mild aortoiliac atherosclerotic disease. The IVC is unremarkable. No portal venous gas. There is no adenopathy. Reproductive: The prostate and seminal vesicles are grossly unremarkable. No pelvic mass. Other: Small fat containing umbilical hernia. Musculoskeletal: No acute or significant osseous findings. IMPRESSION: 1. Fatty liver with findings suggestive of underlying liver disease or steatohepatitis. Correlation with LFTs recommended. 2. Thickened appearance of the gallbladder wall likely related to underlying liver disease. Ultrasound may provide better evaluation of the gallbladder. 3. Mild haziness of the fat surrounding the duodenum, likely related to hepatic enteropathy. Clinical correlation is recommended to exclude duodenitis. No bowel obstruction. 4. Partially visualized moderate right pleural effusion with associated partial compressive atelectasis of the right lung base. Pneumonia is not excluded. Clinical correlation is recommended. 5. Aortic Atherosclerosis (ICD10-I70.0). Electronically Signed   By: Anner Crete M.D.   On: 05/16/2019 21:50   Dg Chest Portable 1 View  Result Date: 05/16/2019 CLINICAL DATA:  Shortness of breath EXAM: PORTABLE CHEST 1 VIEW COMPARISON:  August 18, 2016. FINDINGS: There is airspace opacity in the right lower lung region. The lungs elsewhere are clear. Heart is mildly enlarged with pulmonary vascularity within normal limits. No adenopathy. No bone lesions. IMPRESSION: Airspace opacity consistent with pneumonia right lower lung region. Lungs elsewhere clear. Mild cardiac prominence. No  adenopathy evident. Followup PA and lateral chest radiographs recommended in 3-4 weeks following trial of antibiotic therapy to ensure resolution and exclude underlying malignancy. Electronically Signed   By: Lowella Grip III M.D.   On: 05/16/2019 22:00    EKG: Independently reviewed. Sinus rhythm, PVC, anterior Q-wave.   Assessment/Plan   1. Acute CHF  - Presents with abdominal distension, bilateral LE edema, orthopnea, and SOB  - He is hypervolemic with elevated BNP, right-sided pleural effusion, and orthopnea   - There is concern for CHF, and he also appears to have some liver disease that could be contributing  - He was given Lasix 40 mg IV in ED  - Continue diuresis with Lasix 40 mg IV q12h, check echocardiogram    2. Pneumonia  - RLL pneumonia noted on imaging, patient has been on Augmentin since 10/6 but has missed some doses  - Check strep pneumo and legionella antigens, check sputum culture, continue antibiotics with Rocephin and azithromycin    3. Hypertensive urgency   - DBP 100 in ED in setting of hypervolemia  - Anticipate improvement with diuresis, will use hydralazine IVP's as needed    4. Elevated transaminases  - AST 52 and ALT 88 on admission with suspicion for steatosis/liver disease raised on CT abd/pelvis in ED  - Abdominal exam is benign, he denies significant alcohol use, denies medication use   - Check Hep C, monitor LFT's    5. Elevated troponin  -  HS troponin is 63 in ED without any chest pain  - Low-suspicion for ACS, check delta troponin, follow-up echo    PPE: Mask, face shield  DVT prophylaxis: Lovenox  Code Status: Full  Family Communication: Daughter updated by phone at patient's request  Consults called: None  Admission status: Inpatient. Patient has pneumonia and PORT score is 95, associated with 8-9% risk of mortality and the expert consensus recommends inpatient management for these patients.     Vianne Bulls, MD [facility]  Pager [phone]  If 7PM-7AM, please contact night-coverage [email or web address] Password [email or web address]  05/16/2019, 11:45 PM

## 2019-05-16 NOTE — ED Triage Notes (Signed)
Pt abd has felt bloated and painful x 1 month. Recently dx w/ PNE and is on abx.  Has appoint on wed for CT of abd but is unable to wait.

## 2019-05-17 ENCOUNTER — Encounter (HOSPITAL_COMMUNITY): Payer: Self-pay

## 2019-05-17 ENCOUNTER — Ambulatory Visit (HOSPITAL_COMMUNITY): Payer: Commercial Managed Care - PPO | Attending: Family Medicine

## 2019-05-17 ENCOUNTER — Inpatient Hospital Stay (HOSPITAL_COMMUNITY): Payer: Commercial Managed Care - PPO

## 2019-05-17 DIAGNOSIS — I5031 Acute diastolic (congestive) heart failure: Secondary | ICD-10-CM | POA: Insufficient documentation

## 2019-05-17 DIAGNOSIS — I083 Combined rheumatic disorders of mitral, aortic and tricuspid valves: Secondary | ICD-10-CM | POA: Insufficient documentation

## 2019-05-17 DIAGNOSIS — I361 Nonrheumatic tricuspid (valve) insufficiency: Secondary | ICD-10-CM | POA: Diagnosis not present

## 2019-05-17 DIAGNOSIS — I340 Nonrheumatic mitral (valve) insufficiency: Secondary | ICD-10-CM

## 2019-05-17 DIAGNOSIS — Z720 Tobacco use: Secondary | ICD-10-CM

## 2019-05-17 DIAGNOSIS — R7401 Elevation of levels of liver transaminase levels: Secondary | ICD-10-CM

## 2019-05-17 DIAGNOSIS — I420 Dilated cardiomyopathy: Secondary | ICD-10-CM | POA: Insufficient documentation

## 2019-05-17 DIAGNOSIS — J181 Lobar pneumonia, unspecified organism: Secondary | ICD-10-CM

## 2019-05-17 DIAGNOSIS — I5021 Acute systolic (congestive) heart failure: Secondary | ICD-10-CM

## 2019-05-17 LAB — BASIC METABOLIC PANEL
Anion gap: 10 (ref 5–15)
BUN: 23 mg/dL — ABNORMAL HIGH (ref 6–20)
CO2: 28 mmol/L (ref 22–32)
Calcium: 9 mg/dL (ref 8.9–10.3)
Chloride: 104 mmol/L (ref 98–111)
Creatinine, Ser: 0.84 mg/dL (ref 0.61–1.24)
GFR calc Af Amer: >60 mL/min (ref >60)
GFR calc non Af Amer: >60 mL/min (ref >60)
Glucose, Bld: 96 mg/dL (ref 70–99)
Potassium: 4 mmol/L (ref 3.5–5.1)
Sodium: 142 mmol/L (ref 135–145)

## 2019-05-17 LAB — SARS CORONAVIRUS 2 (TAT 6-24 HRS): SARS Coronavirus 2: NEGATIVE

## 2019-05-17 LAB — TROPONIN I (HIGH SENSITIVITY): Troponin I (High Sensitivity): 62 ng/L — ABNORMAL HIGH (ref <18)

## 2019-05-17 LAB — MAGNESIUM: Magnesium: 2.2 mg/dL (ref 1.7–2.4)

## 2019-05-17 LAB — HEPATITIS PANEL, ACUTE
HCV Ab: NONREACTIVE
Hep A IgM: NONREACTIVE
Hep B C IgM: NONREACTIVE
Hepatitis B Surface Ag: NONREACTIVE

## 2019-05-17 LAB — HEPATIC FUNCTION PANEL
ALT: 73 U/L — ABNORMAL HIGH (ref 0–44)
AST: 54 U/L — ABNORMAL HIGH (ref 15–41)
Albumin: 3.8 g/dL (ref 3.5–5.0)
Alkaline Phosphatase: 65 U/L (ref 38–126)
Bilirubin, Direct: 0.3 mg/dL — ABNORMAL HIGH (ref 0.0–0.2)
Indirect Bilirubin: 0.6 mg/dL (ref 0.3–0.9)
Total Bilirubin: 0.9 mg/dL (ref 0.3–1.2)
Total Protein: 7.3 g/dL (ref 6.5–8.1)

## 2019-05-17 LAB — ECHOCARDIOGRAM COMPLETE
Height: 74 in
Weight: 4112 oz

## 2019-05-17 LAB — HIV ANTIBODY (ROUTINE TESTING W REFLEX): HIV Screen 4th Generation wRfx: NONREACTIVE

## 2019-05-17 LAB — PROCALCITONIN: Procalcitonin: <0.1 ng/mL

## 2019-05-17 MED ORDER — ONDANSETRON HCL 4 MG/2ML IJ SOLN: 4.0000 mg | Freq: Four times a day (QID) | INTRAMUSCULAR | Status: DC | PRN

## 2019-05-17 MED ORDER — ENOXAPARIN SODIUM 40 MG/0.4ML ~~LOC~~ SOLN
40.0000 mg | SUBCUTANEOUS | Status: DC
  Administered 2019-05-17 – 2019-05-19 (×3): 40 mg via SUBCUTANEOUS
  Filled 2019-05-17 (×3): qty 0.4

## 2019-05-17 MED ORDER — SODIUM CHLORIDE 0.9 % IV SOLN
2.0000 g | INTRAVENOUS | Status: DC
  Administered 2019-05-17 – 2019-05-19 (×3): 2 g via INTRAVENOUS
  Filled 2019-05-17 (×3): qty 20

## 2019-05-17 MED ORDER — FUROSEMIDE 10 MG/ML IJ SOLN
40.0000 mg | Freq: Two times a day (BID) | INTRAMUSCULAR | Status: DC
  Administered 2019-05-17 – 2019-05-18 (×3): 40 mg via INTRAVENOUS
  Filled 2019-05-17 (×3): qty 4

## 2019-05-17 MED ORDER — GUAIFENESIN ER 600 MG PO TB12
1200.0000 mg | ORAL_TABLET | Freq: Two times a day (BID) | ORAL | Status: DC
  Administered 2019-05-17 – 2019-05-19 (×4): 1200 mg via ORAL
  Filled 2019-05-17 (×4): qty 2

## 2019-05-17 MED ORDER — SODIUM CHLORIDE 0.9% FLUSH
3.0000 mL | Freq: Two times a day (BID) | INTRAVENOUS | Status: DC
  Administered 2019-05-17 – 2019-05-19 (×5): 3 mL via INTRAVENOUS

## 2019-05-17 MED ORDER — IPRATROPIUM-ALBUTEROL 0.5-2.5 (3) MG/3ML IN SOLN: 3.0000 mL | Freq: Four times a day (QID) | RESPIRATORY_TRACT | Status: DC | PRN

## 2019-05-17 MED ORDER — AZITHROMYCIN 250 MG PO TABS
500.0000 mg | ORAL_TABLET | Freq: Every day | ORAL | Status: DC
  Administered 2019-05-17 – 2019-05-19 (×3): 500 mg via ORAL
  Filled 2019-05-17 (×3): qty 2

## 2019-05-17 MED ORDER — IPRATROPIUM-ALBUTEROL 0.5-2.5 (3) MG/3ML IN SOLN: 3.0000 mL | Freq: Three times a day (TID) | RESPIRATORY_TRACT | Status: DC

## 2019-05-17 MED ORDER — SODIUM CHLORIDE 0.9% FLUSH: 3.0000 mL | INTRAVENOUS | Status: DC | PRN

## 2019-05-17 MED ORDER — ACETAMINOPHEN 325 MG PO TABS: 650.0000 mg | ORAL_TABLET | ORAL | Status: DC | PRN

## 2019-05-17 MED ORDER — SODIUM CHLORIDE 0.9 % IV SOLN: 250.0000 mL | INTRAVENOUS | Status: DC | PRN

## 2019-05-17 NOTE — Progress Notes (Addendum)
PROGRESS NOTE  Logan Kennedy [id number] DOB: Apr 22, 1966 DOA: 05/16/2019 PCP: Redmond School, MD  Brief History:  53 year old male with a history of tobacco abuse with no other documented medical problems presents with 1 month history of lower extremity edema and abdominal bloating type sensation.  He states that he has been having some progressive shortness of breath in the past week prior to admission with orthopnea symptoms.  He denies any fevers, chills, headache, neck pain, hemoptysis, nausea, vomiting, diarrhea.  He saw his primary care provider approximately 1 week prior to this admission.  Apparently chest x-ray was done and there was concern for pneumonia.  The patient was treated with amox/clav, but his coughing or shortness of breath continues.  Unfortunately, the patient continues to smoke.  He has smoked 1.5 packs/day for the last 30 years. In the emergency department, the patient was afebrile hemodynamically stable with oxygen saturation 95 to 100% on room air.  WBC was 11.8.  BMP was unremarkable.  AST 54, ALT 73, alkaline phosphatase 65, total bilirubin 0.9.  Chest x-ray showed right lower lobe opacity with increased interstitial markings compared to 08/18/2016.  CT of the abdomen and pelvis showed hepatic steatosis with a right pleural effusion and thickened gallbladder wall.  Assessment/Plan: Acute systolic CHF -Work-up in progress -Continue IV furosemide -Daily weights -Echo EF 20-25%, diffuse HK, mild MR/TR, mildlly elevated RVSP -Accurate I's and O's -cardiology consult for new cardiomyopathy -may benefit from Entresto--defer to cardiology  Lobar pneumonia -Failed outpatient therapy -Continue ceftriaxone and azithromycin -Check procalcitonin<0.10 -personally reviewed CXR--increased interstital markings, RLL opacity   Tobacco abuse -Tobacco cessation discussed  Transaminasemia -RUQ US-Mild gallbladder wall thickening without ductal dilatation -Suspect  combination of hepatic steatosis and congestion secondary to CHF -viral hepatitis panel--neg  Essential hypertension -Patient is not on any agents in the outpatient setting -Monitor clinically  Elevated troponin -Secondary to demand ischemia -Trend is flat -Personally reviewed EKG--sinus rhythm, nonspecific ST-T wave changes       Disposition Plan:   Home in 2-3 days  Family Communication:   Family at bedside  Consultants:  cardiology  Code Status:  FULL  DVT Prophylaxis: [place] Lovenox   Procedures: As Listed in Progress Note Above  Antibiotics: Ceftriaxone 10/13>>> azithro 10/13>>>      Subjective: Patient is feeling a bit better today.  Continues to have cough with yellow sputum.  He still has some dyspnea on exertion.  He states that his leg edema is a little better than yesterday.  He denies any fevers, chills, proptosis, nausea, vomiting, diarrhea, domino pain.  Objective: Vitals:   05/17/19 0019 05/17/19 0531 05/17/19 0534 05/17/19 0608  BP:  (!) 156/72  (!) 127/92  Pulse:  (!) 48  (!) 52  Resp:   (!) 26 (!) 24  Temp:  98.9 F (37.2 C)  98.5 F (36.9 C)  TempSrc:  Oral  Oral  SpO2: 100% 94%  94%  Weight:      Height:       No intake or output data in the 24 hours ending 05/17/19 0931 Weight change:  Exam:   General:  Pt is alert, follows commands appropriately, not in acute distress  HEENT: No icterus, No thrush, No neck mass, [place]/AT  Cardiovascular: RRR, S1/S2, no rubs, no gallops  Respiratory: Bibasilar rales.  No wheezing.  Good air movement.  Abdomen: Soft/+BS, non tender, non distended, no guarding  Extremities: 1 +LE edema, No lymphangitis, No  petechiae, No rashes, no synovitis   Data Reviewed: I have personally reviewed following labs and imaging studies Basic Metabolic Panel: Recent Labs  Lab 05/16/19 1811 05/17/19 0434  NA 138 142  K 4.1 4.0  CL 104 104  CO2 26 28  GLUCOSE 101* 96  BUN 27* 23*  CREATININE 0.97 0.84   CALCIUM 8.6* 9.0  MG  --  2.2   Liver Function Tests: Recent Labs  Lab 05/16/19 1811 05/17/19 0434  AST 52* 54*  ALT 68* 73*  ALKPHOS 64 65  BILITOT 0.8 0.9  PROT 7.0 7.3  ALBUMIN 3.8 3.8   Recent Labs  Lab 05/16/19 1811  LIPASE 51   No results for input(s): AMMONIA in the last 168 hours. Coagulation Profile: No results for input(s): INR, PROTIME in the last 168 hours. CBC: Recent Labs  Lab 05/16/19 1811  WBC 11.8*  HGB 14.1  HCT 46.2  MCV 96.9  PLT 289   Cardiac Enzymes: No results for input(s): CKTOTAL, CKMB, CKMBINDEX, TROPONINI in the last 168 hours. BNP: Invalid input(s): POCBNP CBG: No results for input(s): GLUCAP in the last 168 hours. HbA1C: No results for input(s): HGBA1C in the last 72 hours. Urine analysis:    Component Value Date/Time   COLORURINE YELLOW 05/16/2019 2028   APPEARANCEUR HAZY (A) 05/16/2019 2028   LABSPEC 1.029 05/16/2019 2028   PHURINE 5.0 05/16/2019 2028   GLUCOSEU NEGATIVE 05/16/2019 2028   HGBUR NEGATIVE 05/16/2019 2028   [place] 05/16/2019 2028   KETONESUR 5 (A) 05/16/2019 2028   PROTEINUR 100 (A) 05/16/2019 2028   NITRITE NEGATIVE 05/16/2019 2028   LEUKOCYTESUR NEGATIVE 05/16/2019 2028   Sepsis Labs: @LABRCNTIP (procalcitonin:4,lacticidven:4) )No results found for this or any previous visit (from the past 240 hour(s)).   Scheduled Meds: . azithromycin  500 mg Oral Daily  . enoxaparin (LOVENOX) injection  40 mg Subcutaneous Q24H  . furosemide  40 mg Intravenous Q12H  . sodium chloride flush  3 mL Intravenous Q12H   Continuous Infusions: . sodium chloride    . cefTRIAXone (ROCEPHIN)  IV Stopped (05/17/19 [id number])    Procedures/Studies: Ct Abdomen Pelvis W Contrast  Result Date: 05/16/2019 CLINICAL DATA:  53 year old male with abdominal pain and bloating. EXAM: CT ABDOMEN AND PELVIS WITH CONTRAST TECHNIQUE: Multidetector CT imaging of the abdomen and pelvis was performed using the standard protocol  following bolus administration of intravenous contrast. CONTRAST:  188mL OMNIPAQUE IOHEXOL 300 MG/ML  SOLN COMPARISON:  None. FINDINGS: Lower chest: Partially visualized moderate right pleural effusion with associated partial compressive atelectasis of the right lung base. Pneumonia is not excluded. Clinical correlation is recommended. There is mild cardiomegaly. Partially visualized small pericardial effusion measuring approximately 10 mm in thickness. There is consolidative changes of the visualized right middle lobe. No intra-abdominal free air. Small free fluid within the pelvis. Hepatobiliary: Apparent fatty infiltration of the liver. No intrahepatic biliary ductal dilatation. There is no calcified gallstone. There is diffuse thickening of the gallbladder wall with pericholecystic stranding and trace fluid. This may be related to underlying liver disease. Ultrasound may provide better evaluation of the gallbladder. Correlation with liver function test recommended. Pancreas: Unremarkable. No pancreatic ductal dilatation or surrounding inflammatory changes. Spleen: Normal in size without focal abnormality. Adrenals/Urinary Tract: Left adrenal thickening/hyperplasia. The right adrenal gland is grossly unremarkable. There is a 2 mm nonobstructing left renal inferior pole calculus. No hydronephrosis. There is no hydronephrosis or nephrolithiasis on the right. There is symmetric enhancement and excretion of contrast by both kidneys.  The visualized ureters and urinary bladder appear unremarkable. Stomach/Bowel: Mild thickened appearance of the distal stomach, likely related to underdistention. Slight haziness of the fat surrounding the duodenal C-loop, likely related to underlying liver disease. Clinical correlation is recommended to exclude duodenitis. There is no bowel obstruction. Appendectomy. Vascular/Lymphatic: Mild aortoiliac atherosclerotic disease. The IVC is unremarkable. No portal venous gas. There is no  adenopathy. Reproductive: The prostate and seminal vesicles are grossly unremarkable. No pelvic mass. Other: Small fat containing umbilical hernia. Musculoskeletal: No acute or significant osseous findings. IMPRESSION: 1. Fatty liver with findings suggestive of underlying liver disease or steatohepatitis. Correlation with LFTs recommended. 2. Thickened appearance of the gallbladder wall likely related to underlying liver disease. Ultrasound may provide better evaluation of the gallbladder. 3. Mild haziness of the fat surrounding the duodenum, likely related to hepatic enteropathy. Clinical correlation is recommended to exclude duodenitis. No bowel obstruction. 4. Partially visualized moderate right pleural effusion with associated partial compressive atelectasis of the right lung base. Pneumonia is not excluded. Clinical correlation is recommended. 5. Aortic Atherosclerosis (ICD10-I70.0). Electronically Signed   By: Anner Crete M.D.   On: 05/16/2019 21:50   Dg Chest Portable 1 Kennedy  Result Date: 05/16/2019 CLINICAL DATA:  Shortness of breath EXAM: PORTABLE CHEST 1 Kennedy COMPARISON:  August 18, 2016. FINDINGS: There is airspace opacity in the right lower lung region. The lungs elsewhere are clear. Heart is mildly enlarged with pulmonary vascularity within normal limits. No adenopathy. No bone lesions. IMPRESSION: Airspace opacity consistent with pneumonia right lower lung region. Lungs elsewhere clear. Mild cardiac prominence. No adenopathy evident. Followup PA and lateral chest radiographs recommended in 3-4 weeks following trial of antibiotic therapy to ensure resolution and exclude underlying malignancy. Electronically Signed   By: Lowella Grip III M.D.   On: 05/16/2019 22:00   Dg Abd Acute 2+v W 1v Chest  Result Date: 05/10/2019 CLINICAL DATA:  Abdominal pain, generalized abdominal pain for 1 month EXAM: DG ABDOMEN ACUTE W/ 1V CHEST COMPARISON:  08/18/2016 FINDINGS: Right lower lobe airspace  disease in the chest.  Mildly enlarged. No free air beneath either right or left hemidiaphragm. No signs of bowel obstruction. No acute bone finding. IMPRESSION: Right lower lobe pneumonia. Follow-up is suggested to ensure resolution. No acute intra-abdominal finding. These results will be called to the ordering clinician or representative by the Radiologist Assistant, and communication documented in the PACS or zVision Dashboard. Electronically Signed   By: Zetta Bills M.D.   On: 05/10/2019 08:38    Orson Eva, DO  [facility] Pager [phone]  If 7PM-7AM, please contact night-coverage [email or web address] Password [email or web address] 05/17/2019, 9:31 AM   LOS: 1 day

## 2019-05-17 NOTE — Progress Notes (Signed)
  Echocardiogram 2D Echocardiogram has been performed.  Logan Kennedy 05/17/2019, 2:46 PM

## 2019-05-18 ENCOUNTER — Encounter (HOSPITAL_COMMUNITY): Payer: Self-pay | Admitting: Cardiology

## 2019-05-18 ENCOUNTER — Encounter (HOSPITAL_COMMUNITY): Payer: Self-pay | Admitting: *Deleted

## 2019-05-18 ENCOUNTER — Ambulatory Visit (HOSPITAL_COMMUNITY): Payer: Commercial Managed Care - PPO

## 2019-05-18 ENCOUNTER — Encounter (HOSPITAL_COMMUNITY): Payer: Self-pay

## 2019-05-18 DIAGNOSIS — I429 Cardiomyopathy, unspecified: Secondary | ICD-10-CM

## 2019-05-18 DIAGNOSIS — I5041 Acute combined systolic (congestive) and diastolic (congestive) heart failure: Secondary | ICD-10-CM

## 2019-05-18 DIAGNOSIS — J189 Pneumonia, unspecified organism: Secondary | ICD-10-CM

## 2019-05-18 LAB — BASIC METABOLIC PANEL
Anion gap: 11 (ref 5–15)
BUN: 21 mg/dL — ABNORMAL HIGH (ref 6–20)
CO2: 31 mmol/L (ref 22–32)
Calcium: 9.2 mg/dL (ref 8.9–10.3)
Chloride: 99 mmol/L (ref 98–111)
Creatinine, Ser: 0.9 mg/dL (ref 0.61–1.24)
GFR calc Af Amer: >60 mL/min (ref >60)
GFR calc non Af Amer: >60 mL/min (ref >60)
Glucose, Bld: 112 mg/dL — ABNORMAL HIGH (ref 70–99)
Potassium: 3.9 mmol/L (ref 3.5–5.1)
Sodium: 141 mmol/L (ref 135–145)

## 2019-05-18 LAB — CBC
HCT: 49.4 % (ref 39.0–52.0)
Hemoglobin: 14.7 g/dL (ref 13.0–17.0)
MCH: 29.1 pg (ref 26.0–34.0)
MCHC: 29.8 g/dL — ABNORMAL LOW (ref 30.0–36.0)
MCV: 97.8 fL (ref 80.0–100.0)
Platelets: 302 10*3/uL (ref 150–400)
RBC: 5.05 MIL/uL (ref 4.22–5.81)
RDW: 14.3 % (ref 11.5–15.5)
WBC: 11.2 10*3/uL — ABNORMAL HIGH (ref 4.0–10.5)
nRBC: 0 % (ref 0.0–0.2)

## 2019-05-18 LAB — STREP PNEUMONIAE URINARY ANTIGEN: Strep Pneumo Urinary Antigen: NEGATIVE

## 2019-05-18 MED ORDER — GUAIFENESIN-DM 100-10 MG/5ML PO SYRP
5.0000 mL | ORAL_SOLUTION | ORAL | Status: DC | PRN
  Administered 2019-05-18 – 2019-05-19 (×2): 5 mL via ORAL
  Filled 2019-05-18 (×2): qty 5

## 2019-05-18 MED ORDER — CARVEDILOL 3.125 MG PO TABS
3.1250 mg | ORAL_TABLET | Freq: Two times a day (BID) | ORAL | Status: DC
  Administered 2019-05-18 – 2019-05-19 (×2): 3.125 mg via ORAL
  Filled 2019-05-18 (×2): qty 1

## 2019-05-18 MED ORDER — SPIRONOLACTONE 25 MG PO TABS
12.5000 mg | ORAL_TABLET | Freq: Every day | ORAL | Status: DC
  Administered 2019-05-18 – 2019-05-19 (×2): 12.5 mg via ORAL
  Filled 2019-05-18 (×2): qty 0.5
  Filled 2019-05-18: qty 1
  Filled 2019-05-18 (×4): qty 0.5
  Filled 2019-05-18: qty 1

## 2019-05-18 MED ORDER — FUROSEMIDE 10 MG/ML IJ SOLN
40.0000 mg | Freq: Every day | INTRAMUSCULAR | Status: DC
  Administered 2019-05-19: 40 mg via INTRAVENOUS
  Filled 2019-05-18: qty 4

## 2019-05-18 NOTE — Progress Notes (Signed)
PROGRESS NOTE  Logan Kennedy [id number] DOB: [birth year] DOA: 05/16/2019 PCP: Redmond School, MD  Brief History:  53 year old male with a history of tobacco abuse with no other documented medical problems presents with 1 month history of lower extremity edema and abdominal bloating type sensation.  He states that he has been having some progressive shortness of breath in the past week prior to admission with orthopnea symptoms.  He denies any fevers, chills, headache, neck pain, hemoptysis, nausea, vomiting, diarrhea.  He saw his primary care provider approximately 1 week prior to this admission.  Apparently chest x-ray was done and there was concern for pneumonia.  The patient was treated with amox/clav, but his coughing or shortness of breath continues.  Unfortunately, the patient continues to smoke.  He has smoked 1.5 packs/day for the last 30 years. In the emergency department, the patient was afebrile hemodynamically stable with oxygen saturation 95 to 100% on room air.  WBC was 11.8.  BMP was unremarkable.  AST 54, ALT 73, alkaline phosphatase 65, total bilirubin 0.9.  Chest x-ray showed right lower lobe opacity with increased interstitial markings compared to 08/18/2016.  CT of the abdomen and pelvis showed hepatic steatosis with a right pleural effusion and thickened gallbladder wall.  Assessment/Plan: Acute systolic CHF -Work-up in progress -Continue IV furosemide, now change to once a day. -started on low dose coreg and aldactone.  -continue Daily weights and low sodium diet -Echo EF 20-25%, diffuse HK, mild MR/TR, mildlly elevated RVSP -continue strict I's and O's -cardiology consulted and will follow recommendation for medical management.  Lobar pneumonia -Failed outpatient therapy -Continue ceftriaxone and azithromycin -Checked procalcitonin <0.10 -personally reviewed CXR--increased interstital markings, positive RLL opacity   Tobacco abuse -Tobacco cessation  discussed -Patient is motivated to quit -decline nicotine patch.   Transaminasemia -RUQ US-Mild gallbladder wall thickening without ductal dilatation -Suspect combination of hepatic steatosis and congestion secondary to CHF -viral hepatitis panel--neg -continue to monitor trend   Essential hypertension -Patient is not on any agents in the outpatient setting -follow VS with initiated management for HF -heart healthy/low sodium diet discussed with patient   Elevated troponin -Secondary to demand ischemia -Trend is flat -Personally reviewed EKG--sinus rhythm, nonspecific ST-T wave changes -continue medical management    Disposition Plan:   Home in 2-3 days   Family Communication:   Family updated at bedside through face time.  Consultants:  cardiology  Code Status:  FULL  DVT Prophylaxis: [place] Lovenox   Procedures: As Listed in Progress Note Above  Antibiotics: Ceftriaxone 10/13>>> azithro 10/13>>>   Subjective: No fever, no chest pain, no nausea, no vomiting.  Patient reports improvement in his breathing, denies palpitations and orthopnea.   Objective: Vitals:   05/17/19 0949 05/17/19 1456 05/17/19 2045 05/18/19 0543  BP: (!) 148/94 117/89 124/90 105/87  Pulse: (!) 105 97 (!) 104 91  Resp: 18 (!) 22 20 18   Temp: 98 F (36.7 C) 98.6 F (37 C) 98.4 F (36.9 C) 97.8 F (36.6 C)  TempSrc: Oral  Oral Oral  SpO2: 92% 96% 92% 90%  Weight:      Height:        Intake/Output Summary (Last 24 hours) at 05/18/2019 1336 Last data filed at 05/18/2019 1200 Gross per 24 hour  Intake 480 ml  Output 600 ml  Net -120 ml   Weight change:   Exam: General exam: Alert, awake, oriented x 3, reports feeling better, breathing easier,  no chest pain, no nausea, no vomiting. Respiratory system: Improved air movement bilaterally, clear to auscultation. Respiratory effort normal. Cardiovascular system:RRR. No murmurs, rubs, gallops. Gastrointestinal system: Abdomen is  nondistended, soft and nontender. No organomegaly or masses felt. Normal bowel sounds heard. Central nervous system: Alert and oriented. No focal neurological deficits. Extremities: No cyanosis or clubbing; trace edema bilaterally. Skin: No rashes, lesions or ulcers Psychiatry: Judgement and insight appear normal. Mood & affect appropriate.    Data Reviewed: I have personally reviewed following labs and imaging studies  Basic Metabolic Panel: Recent Labs  Lab 05/16/19 1811 05/17/19 0434 05/18/19 0539  NA 138 142 141  K 4.1 4.0 3.9  CL 104 104 99  CO2 26 28 31   GLUCOSE 101* 96 112*  BUN 27* 23* 21*  CREATININE 0.97 0.84 0.90  CALCIUM 8.6* 9.0 9.2  MG  --  2.2  --    Liver Function Tests: Recent Labs  Lab 05/16/19 1811 05/17/19 0434  AST 52* 54*  ALT 68* 73*  ALKPHOS 64 65  BILITOT 0.8 0.9  PROT 7.0 7.3  ALBUMIN 3.8 3.8   Recent Labs  Lab 05/16/19 1811  LIPASE 51   CBC: Recent Labs  Lab 05/16/19 1811 05/18/19 0539  WBC 11.8* 11.2*  HGB 14.1 14.7  HCT 46.2 49.4  MCV 96.9 97.8  PLT 289 302   Urine analysis:    Component Value Date/Time   COLORURINE YELLOW 05/16/2019 2028   APPEARANCEUR HAZY (A) 05/16/2019 2028   LABSPEC 1.029 05/16/2019 2028   PHURINE 5.0 05/16/2019 2028   GLUCOSEU NEGATIVE 05/16/2019 2028   HGBUR NEGATIVE 05/16/2019 2028   [place] NEGATIVE 05/16/2019 2028   KETONESUR 5 (A) 05/16/2019 2028   PROTEINUR 100 (A) 05/16/2019 2028   NITRITE NEGATIVE 05/16/2019 2028   LEUKOCYTESUR NEGATIVE 05/16/2019 2028    Recent Results (from the past 240 hour(s))  SARS CORONAVIRUS 2 (TAT 6-24 HRS) Nasopharyngeal Nasopharyngeal Swab     Status: None   Collection Time: 05/16/19 11:37 PM   Specimen: Nasopharyngeal Swab  Result Value Ref Range Status   SARS Coronavirus 2 NEGATIVE NEGATIVE Final    Comment: (NOTE) SARS-CoV-2 target nucleic acids are NOT DETECTED. The SARS-CoV-2 RNA is generally detectable in upper and lower respiratory specimens  during the acute phase of infection. Negative results do not preclude SARS-CoV-2 infection, do not rule out co-infections with other pathogens, and should not be used as the sole basis for treatment or other patient management decisions. Negative results must be combined with clinical observations, patient history, and epidemiological information. The expected result is Negative. Fact Sheet for Patients: SugarRoll.be Fact Sheet for Healthcare Providers: https://www.woods-mathews.com/ This test is not yet approved or cleared by the [place] FDA and  has been authorized for detection and/or diagnosis of SARS-CoV-2 by FDA under an Emergency Use Authorization (EUA). This EUA will remain  in effect (meaning this test can be used) for the duration of the COVID-19 declaration under Section 56 4(b)(1) of the Act, 21 U.S.C. section 360bbb-3(b)(1), unless the authorization is terminated or revoked sooner. Performed at [facility], [place] [address] [place], [place] [address]      Scheduled Meds: . azithromycin  500 mg Oral Daily  . carvedilol  3.125 mg Oral BID WC  . enoxaparin (LOVENOX) injection  40 mg Subcutaneous Q24H  . [START ON 05/19/2019] furosemide  40 mg Intravenous Daily  . guaiFENesin  1,200 mg Oral BID  . sodium chloride flush  3 mL Intravenous  Q12H  . spironolactone  12.5 mg Oral Daily   Continuous Infusions: . sodium chloride    . cefTRIAXone (ROCEPHIN)  IV 2 g (05/18/19 0005)    Procedures/Studies: Ct Abdomen Pelvis W Contrast  Result Date: 05/16/2019 CLINICAL DATA:  54 year old male with abdominal pain and bloating. EXAM: CT ABDOMEN AND PELVIS WITH CONTRAST TECHNIQUE: Multidetector CT imaging of the abdomen and pelvis was performed using the standard protocol following bolus administration of intravenous contrast. CONTRAST:  171mL OMNIPAQUE IOHEXOL 300 MG/ML  SOLN COMPARISON:  None. FINDINGS: Lower chest: Partially  visualized moderate right pleural effusion with associated partial compressive atelectasis of the right lung base. Pneumonia is not excluded. Clinical correlation is recommended. There is mild cardiomegaly. Partially visualized small pericardial effusion measuring approximately 10 mm in thickness. There is consolidative changes of the visualized right middle lobe. No intra-abdominal free air. Small free fluid within the pelvis. Hepatobiliary: Apparent fatty infiltration of the liver. No intrahepatic biliary ductal dilatation. There is no calcified gallstone. There is diffuse thickening of the gallbladder wall with pericholecystic stranding and trace fluid. This may be related to underlying liver disease. Ultrasound may provide better evaluation of the gallbladder. Correlation with liver function test recommended. Pancreas: Unremarkable. No pancreatic ductal dilatation or surrounding inflammatory changes. Spleen: Normal in size without focal abnormality. Adrenals/Urinary Tract: Left adrenal thickening/hyperplasia. The right adrenal gland is grossly unremarkable. There is a 2 mm nonobstructing left renal inferior pole calculus. No hydronephrosis. There is no hydronephrosis or nephrolithiasis on the right. There is symmetric enhancement and excretion of contrast by both kidneys. The visualized ureters and urinary bladder appear unremarkable. Stomach/Bowel: Mild thickened appearance of the distal stomach, likely related to underdistention. Slight haziness of the fat surrounding the duodenal C-loop, likely related to underlying liver disease. Clinical correlation is recommended to exclude duodenitis. There is no bowel obstruction. Appendectomy. Vascular/Lymphatic: Mild aortoiliac atherosclerotic disease. The IVC is unremarkable. No portal venous gas. There is no adenopathy. Reproductive: The prostate and seminal vesicles are grossly unremarkable. No pelvic mass. Other: Small fat containing umbilical hernia.  Musculoskeletal: No acute or significant osseous findings. IMPRESSION: 1. Fatty liver with findings suggestive of underlying liver disease or steatohepatitis. Correlation with LFTs recommended. 2. Thickened appearance of the gallbladder wall likely related to underlying liver disease. Ultrasound may provide better evaluation of the gallbladder. 3. Mild haziness of the fat surrounding the duodenum, likely related to hepatic enteropathy. Clinical correlation is recommended to exclude duodenitis. No bowel obstruction. 4. Partially visualized moderate right pleural effusion with associated partial compressive atelectasis of the right lung base. Pneumonia is not excluded. Clinical correlation is recommended. 5. Aortic Atherosclerosis (ICD10-I70.0). Electronically Signed   By: Anner Crete M.D.   On: 05/16/2019 21:50   Dg Chest Portable 1 View  Result Date: 05/16/2019 CLINICAL DATA:  Shortness of breath EXAM: PORTABLE CHEST 1 VIEW COMPARISON:  August 18, 2016. FINDINGS: There is airspace opacity in the right lower lung region. The lungs elsewhere are clear. Heart is mildly enlarged with pulmonary vascularity within normal limits. No adenopathy. No bone lesions. IMPRESSION: Airspace opacity consistent with pneumonia right lower lung region. Lungs elsewhere clear. Mild cardiac prominence. No adenopathy evident. Followup PA and lateral chest radiographs recommended in 3-4 weeks following trial of antibiotic therapy to ensure resolution and exclude underlying malignancy. Electronically Signed   By: Lowella Grip III M.D.   On: 05/16/2019 22:00   Dg Abd Acute 2+v W 1v Chest  Result Date: 05/10/2019 CLINICAL DATA:  Abdominal pain, generalized abdominal pain  for 1 month EXAM: DG ABDOMEN ACUTE W/ 1V CHEST COMPARISON:  08/18/2016 FINDINGS: Right lower lobe airspace disease in the chest.  Mildly enlarged. No free air beneath either right or left hemidiaphragm. No signs of bowel obstruction. No acute bone finding.  IMPRESSION: Right lower lobe pneumonia. Follow-up is suggested to ensure resolution. No acute intra-abdominal finding. These results will be called to the ordering clinician or representative by the Radiologist Assistant, and communication documented in the PACS or zVision Dashboard. Electronically Signed   By: Zetta Bills M.D.   On: 05/10/2019 08:38   US Abdomen Limited Ruq  Result Date: 05/17/2019 CLINICAL DATA:  Abnormal lab values. EXAM: ULTRASOUND ABDOMEN LIMITED RIGHT UPPER QUADRANT COMPARISON:  CT abdomen pelvis 05/16/2019 FINDINGS: Gallbladder: The gallbladder wall is mildly thickened measuring 0.5 cm. No gallstones visualized. No sonographic Murphy sign noted by sonographer. Common bile duct: Diameter: 0.5 cm Liver: No focal lesion identified. Within normal limits in parenchymal echogenicity. Portal vein is patent on color Doppler imaging with normal direction of blood flow towards the liver. Other: Right pleural effusion. IMPRESSION: Mild gallbladder wall thickening which is a nonspecific finding and can be seen in the setting of fluid overload states, hypoalbuminemia, liver pathology such as cirrhosis, and gallbladder inflammation, among others. No other findings to suggest acute cholecystitis. Electronically Signed   By: Audie Pinto M.D.   On: 05/17/2019 14:47    Barton Dubois, MD  [facility] Pager [phone]  05/18/2019, 1:36 PM   LOS: 2 days

## 2019-05-18 NOTE — Consult Note (Addendum)
Cardiology Consult    Patient ID: JAJA PROUSE; [id number]; Jan 02, 1966   Admit date: 05/16/2019 Date of Consult: 05/18/2019  Primary Care Provider: Redmond School, MD Primary Cardiologist: New to [facility] - Dr. Domenic Polite  Patient Profile    Logan Kennedy is a 53 y.o. male with past medical history of tobacco use and no prior cardiac history who is being seen today for the evaluation of cardiomyopathy at the request of Dr. Carles Collet.   History of Present Illness    Mr. Rosati presented to [facility] ED on 05/16/2019 for evaluation of worsening abdominal distention and dyspnea for the past month. He reported having been evaluated by his PCP recently and CXR confirmed pneumonia and he was placed on antibiotic therapy.  In talking with the patient today, he reports he initially presented to the ED for evaluation of abdominal pressure. He says that he does not weigh himself regularly and is unsure of his baseline weight but has noticed worsening abdominal distention and lower extremity edema for the past several weeks. Has been experiencing dyspnea but says this had actually started to improve prior to admission. He denies any specific orthopnea, PND, or dizziness. No recent chest pain or palpitations.  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 No known history of CAD, CHF, or cardiac arrhythmias. Reports that his mother does have CHF with an estimated EF of 30%. He is a current smoker and was previously smoking 2 packs/day but says he has reduced his use to a pack per week within the past few weeks. He reports social alcohol use but denies any heavy drinking. No recreational drugs.  Initial labs upon admission showed WBC 11.8, Hgb 14.1, platelets 289, Na+ 138, K+ 4.1, and creatinine 0.97.  BNP 607.  Covid negative. Initial HS Troponin 63 with repeat value of 62. LFT's elevated with AST 54 and ALT 73. Mg 2.2. EKG showed sinus rhythm, HR 98 with multiform PVC's and  ST depression along inferior leads with no prior tracings available for comparison. CXR shows airspace opacity consistent with pneumonia right lower lung region. CT Abdomen showed fatty liver with findings suggestive of underlying liver disease or steatohepatitis and thickened appearance of the gallbladder. Did have a partially visualized moderate right pleural effusion with associated compressive atelectasis. Abdominal US upon admission showed mild gallbladder wall thickening but no evidence of acute cholecystitis.  He was admitted for an acute CHF exacerbation and started on IV Lasix 40 mg twice daily. I&O's and daily weights have not been recorded. Was also started on Rocephin and Azithromycin given concern for RLL pneumonia. Echocardiogram was performed on 05/17/2019 and showed a reduced EF of 20 to 25% with diffuse hypokinesis.  Was noted to have a moderately dilated LA and mild tricuspid regurgitation. Did have mild aortic valve sclerosis without stenosis.  He reports significant improvement in his abdominal distention since admission and feels back to baseline.  Questions when he can go home.    Past Medical History:  Diagnosis Date   Tobacco use     Past Surgical History:  Procedure Laterality Date   APPENDECTOMY     COLONOSCOPY N/A 11/14/2014   Procedure: COLONOSCOPY;  Surgeon: Aviva Signs Md, MD;  Location: [facility];  Service: Gastroenterology;  Laterality: N/A;     Home Medications:  Prior to Admission medications   Medication Sig Start Date End Date Taking? Authorizing Provider  amoxicillin-clavulanate (AUGMENTIN) 875-125 MG tablet Take 1  tablet by mouth 2 (two) times daily. 10 day course starting on 05/10/2019 05/10/19  Yes [provider]  mometasone (NASONEX) 50 MCG/ACT nasal spray Place 2 sprays into the nose daily.  05/10/19  Yes [provider]    Inpatient Medications: Scheduled Meds:  azithromycin  500 mg Oral Daily   enoxaparin (LOVENOX)  injection  40 mg Subcutaneous Q24H   furosemide  40 mg Intravenous Q12H   guaiFENesin  1,200 mg Oral BID   sodium chloride flush  3 mL Intravenous Q12H   Continuous Infusions:  sodium chloride     cefTRIAXone (ROCEPHIN)  IV 2 g (05/18/19 0005)   PRN Meds: sodium chloride, acetaminophen, hydrALAZINE, ipratropium-albuterol, ondansetron (ZOFRAN) IV, sodium chloride flush  Allergies:   No Known Allergies  Social History:   Social History   Socioeconomic History   Marital status: Married    Spouse name: Not on file   Number of children: Not on file   Years of education: Not on file   Highest education level: Not on file  Occupational History   Not on file  Social Needs   Financial resource strain: Not on file   Food insecurity    Worry: Not on file    Inability: Not on file   Transportation needs    Medical: Not on file    Non-medical: Not on file  Tobacco Use   Smoking status: Current Every Day Smoker    Packs/day: 1.50    Years: 33.00    Pack years: 49.50    Types: Cigarettes   Smokeless tobacco: Never Used  Substance and Sexual Activity   Alcohol use: No   Drug use: No   Sexual activity: Not on file  Lifestyle   Physical activity    Days per week: Not on file    Minutes per session: Not on file   Stress: Not on file  Relationships   Social connections    Talks on phone: Not on file    Gets together: Not on file    Attends religious service: Not on file    Active member of club or organization: Not on file    Attends meetings of clubs or organizations: Not on file    Relationship status: Not on file   Intimate partner violence    Fear of current or ex partner: Not on file    Emotionally abused: Not on file    Physically abused: Not on file    Forced sexual activity: Not on file  Other Topics Concern   Not on file  Social History Narrative   Not on file     Family History:    Family History  Problem Relation Age of Onset    Heart failure Mother    COPD Father       Review of Systems    General:  No chills, fever, night sweats or weight changes.  Cardiovascular:  No chest pain, orthopnea, palpitations, paroxysmal nocturnal dyspnea. Positive for edema and dyspnea on exertion.  Dermatological: No rash, lesions/masses Respiratory: No cough, dyspnea Urologic: No hematuria, dysuria Abdominal:   No nausea, vomiting, diarrhea, bright red blood per rectum, melena, or hematemesis. Positive for abdominal pain.  Neurologic:  No visual changes, wkns, changes in mental status. All other systems reviewed and are otherwise negative except as noted above.  Physical Exam/Data    Vitals:   05/17/19 0949 05/17/19 1456 05/17/19 2045 05/18/19 0543  BP: (!) 148/94 117/89 124/90 105/87  Pulse: (!) 105  97 (!) 104 91  Resp: 18 (!) 22 20 18   Temp: 98 F (36.7 C) 98.6 F (37 C) 98.4 F (36.9 C) 97.8 F (36.6 C)  TempSrc: Oral  Oral Oral  SpO2: 92% 96% 92% 90%  Weight:      Height:        Intake/Output Summary (Last 24 hours) at 05/18/2019 1116 Last data filed at 05/18/2019 0900 Gross per 24 hour  Intake 480 ml  Output --  Net 480 ml   Filed Weights   05/16/19 1619  Weight: 116.6 kg   Body mass index is 33 kg/m.   General: Pleasant male appearing in NAD Psych: Normal affect. Neuro: Alert and oriented X 3. Moves all extremities spontaneously. HEENT: Normal  Neck: Supple without bruits. JVD at 9cm. Lungs:  Resp regular and unlabored, rales along bases bilaterally. Heart: RRR with occasional ectopic beats. No s3, s4, or murmurs. Abdomen: Soft, non-tender, non-distended, BS + x 4.  Extremities: No clubbing, cyanosis or edema. DP/PT/Radials 2+ and equal bilaterally.   EKG:  The EKG was personally reviewed and demonstrates:  NSR, HR 98 with multiform PVC's and ST depression along inferior leads with no prior tracings available for comparison.   Telemetry:  Telemetry was personally reviewed and demonstrates:   NSR, HR in 80's to 90's with frequent PVC's, sometimes occurring in a trigeminal pattern.   Labs/Studies     Relevant CV Studies:  Echocardiogram: 05/17/2019 IMPRESSIONS    1. Left ventricular ejection fraction, by visual estimation, is 20 to 25%. The left ventricle has normal function. Moderately increased left ventricular size. Left ventricular septal wall thickness was normal. There is no left ventricular hypertrophy.  2. Left ventricular diastolic Doppler parameters are consistent with restrictive filling pattern of LV diastolic filling.  3. Diffuse hypokinesis.  4. Global right ventricle has normal systolic function.The right ventricular size is normal. No increase in right ventricular wall thickness.  5. Left atrial size was moderately dilated.  6. Right atrial size was normal.  7. The mitral valve is normal in structure. Mild mitral valve regurgitation. No evidence of mitral stenosis.  8. The tricuspid valve is normal in structure. Tricuspid valve regurgitation is mild.  9. The aortic valve is tricuspid Aortic valve regurgitation was not visualized by color flow Doppler. Mild aortic valve sclerosis without stenosis. 10. The pulmonic valve was normal in structure. Pulmonic valve regurgitation is not visualized by color flow Doppler. 11. Mildly elevated pulmonary artery systolic pressure. 12. The inferior vena cava is normal in size with greater than 50% respiratory variability, suggesting right atrial pressure of 3 mmHg.  Laboratory Data:  Chemistry Recent Labs  Lab 05/16/19 1811 05/17/19 0434 05/18/19 0539  NA 138 142 141  K 4.1 4.0 3.9  CL 104 104 99  CO2 26 28 31   GLUCOSE 101* 96 112*  BUN 27* 23* 21*  CREATININE 0.97 0.84 0.90  CALCIUM 8.6* 9.0 9.2  GFRNONAA >60 >60 >60  GFRAA >60 >60 >60  ANIONGAP 8 10 11     Recent Labs  Lab 05/16/19 1811 05/17/19 0434  PROT 7.0 7.3  ALBUMIN 3.8 3.8  AST 52* 54*  ALT 68* 73*  ALKPHOS 64 65  BILITOT 0.8 0.9    Hematology Recent Labs  Lab 05/16/19 1811 05/18/19 0539  WBC 11.8* 11.2*  RBC 4.77 5.05  HGB 14.1 14.7  HCT 46.2 49.4  MCV 96.9 97.8  MCH 29.6 29.1  MCHC 30.5 29.8*  RDW 13.9 14.3  PLT 289 302  Cardiac EnzymesNo results for input(s): TROPONINI in the last 168 hours. No results for input(s): TROPIPOC in the last 168 hours.  BNP Recent Labs  Lab 05/16/19 1811  BNP 607.0*    DDimer No results for input(s): DDIMER in the last 168 hours.  Radiology/Studies:  Ct Abdomen Pelvis W Contrast  Result Date: 05/16/2019 CLINICAL DATA:  53 year old male with abdominal pain and bloating. EXAM: CT ABDOMEN AND PELVIS WITH CONTRAST TECHNIQUE: Multidetector CT imaging of the abdomen and pelvis was performed using the standard protocol following bolus administration of intravenous contrast. CONTRAST:  127mL OMNIPAQUE IOHEXOL 300 MG/ML  SOLN COMPARISON:  None. FINDINGS: Lower chest: Partially visualized moderate right pleural effusion with associated partial compressive atelectasis of the right lung base. Pneumonia is not excluded. Clinical correlation is recommended. There is mild cardiomegaly. Partially visualized small pericardial effusion measuring approximately 10 mm in thickness. There is consolidative changes of the visualized right middle lobe. No intra-abdominal free air. Small free fluid within the pelvis. Hepatobiliary: Apparent fatty infiltration of the liver. No intrahepatic biliary ductal dilatation. There is no calcified gallstone. There is diffuse thickening of the gallbladder wall with pericholecystic stranding and trace fluid. This may be related to underlying liver disease. Ultrasound may provide better evaluation of the gallbladder. Correlation with liver function test recommended. Pancreas: Unremarkable. No pancreatic ductal dilatation or surrounding inflammatory changes. Spleen: Normal in size without focal abnormality. Adrenals/Urinary Tract: Left adrenal thickening/hyperplasia. The  right adrenal gland is grossly unremarkable. There is a 2 mm nonobstructing left renal inferior pole calculus. No hydronephrosis. There is no hydronephrosis or nephrolithiasis on the right. There is symmetric enhancement and excretion of contrast by both kidneys. The visualized ureters and urinary bladder appear unremarkable. Stomach/Bowel: Mild thickened appearance of the distal stomach, likely related to underdistention. Slight haziness of the fat surrounding the duodenal C-loop, likely related to underlying liver disease. Clinical correlation is recommended to exclude duodenitis. There is no bowel obstruction. Appendectomy. Vascular/Lymphatic: Mild aortoiliac atherosclerotic disease. The IVC is unremarkable. No portal venous gas. There is no adenopathy. Reproductive: The prostate and seminal vesicles are grossly unremarkable. No pelvic mass. Other: Small fat containing umbilical hernia. Musculoskeletal: No acute or significant osseous findings. IMPRESSION: 1. Fatty liver with findings suggestive of underlying liver disease or steatohepatitis. Correlation with LFTs recommended. 2. Thickened appearance of the gallbladder wall likely related to underlying liver disease. Ultrasound may provide better evaluation of the gallbladder. 3. Mild haziness of the fat surrounding the duodenum, likely related to hepatic enteropathy. Clinical correlation is recommended to exclude duodenitis. No bowel obstruction. 4. Partially visualized moderate right pleural effusion with associated partial compressive atelectasis of the right lung base. Pneumonia is not excluded. Clinical correlation is recommended. 5. Aortic Atherosclerosis (ICD10-I70.0). Electronically Signed   By: Anner Crete M.D.   On: 05/16/2019 21:50   Dg Chest Portable 1 View  Result Date: 05/16/2019 CLINICAL DATA:  Shortness of breath EXAM: PORTABLE CHEST 1 VIEW COMPARISON:  August 18, 2016. FINDINGS: There is airspace opacity in the right lower lung region.  The lungs elsewhere are clear. Heart is mildly enlarged with pulmonary vascularity within normal limits. No adenopathy. No bone lesions. IMPRESSION: Airspace opacity consistent with pneumonia right lower lung region. Lungs elsewhere clear. Mild cardiac prominence. No adenopathy evident. Followup PA and lateral chest radiographs recommended in 3-4 weeks following trial of antibiotic therapy to ensure resolution and exclude underlying malignancy. Electronically Signed   By: Lowella Grip III M.D.   On: 05/16/2019 22:00   US Abdomen  Limited Ruq  Result Date: 05/17/2019 CLINICAL DATA:  Abnormal lab values. EXAM: ULTRASOUND ABDOMEN LIMITED RIGHT UPPER QUADRANT COMPARISON:  CT abdomen pelvis 05/16/2019 FINDINGS: Gallbladder: The gallbladder wall is mildly thickened measuring 0.5 cm. No gallstones visualized. No sonographic Murphy sign noted by sonographer. Common bile duct: Diameter: 0.5 cm Liver: No focal lesion identified. Within normal limits in parenchymal echogenicity. Portal vein is patent on color Doppler imaging with normal direction of blood flow towards the liver. Other: Right pleural effusion. IMPRESSION: Mild gallbladder wall thickening which is a nonspecific finding and can be seen in the setting of fluid overload states, hypoalbuminemia, liver pathology such as cirrhosis, and gallbladder inflammation, among others. No other findings to suggest acute cholecystitis. Electronically Signed   By: Audie Pinto M.D.   On: 05/17/2019 14:47     Assessment & Plan    1. Acute Systolic CHF Exacerbation/New Cardiomyopathy - Presented with worsening dyspnea, abdominal distention and lower extremity edema, found to have a new cardiomyopathy with EF reduced to 20 to 25%. New diagnosis for the patient but he does report a family history of CHF with his mother having an EF of 30% (unsure of age of onset).  - he has been started on IV Lasix 40 mg twice daily and reports significant improvement in his  symptoms. Unfortunately, I&O's and daily weights have not been recorded. Will ask for a standing weight to be obtained today. - sodium and fluid restriction reviewed. In regards to medical therapy, will plan to add low-dose Coreg 3.125 mg twice daily and assess BP response (BP 105/87 this AM). Unsure if he will be able to tolerate ACE-I/ARB/or ARNI. Would consider Digoxin prior to discharge.   2. Elevated LFT's -  AST 54 and ALT 73 on admission. Possibly secondary to hepatic congestion in the setting of acute CHF excaerbation.  Abdominal ultrasound without acute findings.  Hepatitis panel negative.  3. PNA - He is currently being treated with Azithromycin and Rocephin by the admitting team. WBC 11.2 this AM.   4. Tobacco Use - previously smoked 2 ppd for 30 years but has recently reduced his use to 1 pack per week. Continued reduction advised.   For questions or updates, please contact [place] Please consult [email or web address] for contact info under Cardiology/STEMI.  Signed, Erma Heritage, PA-C 05/18/2019, 11:16 AM Pager: [phone]   Attending note:  Patient seen and examined.  I reviewed the available records and discussed the case with Ms. Ahmed Prima PA-C.  He has been diagnosed with a cardiomyopathy, LVEF in the range of 20 to 25%.  Presenting symptoms include shortness of breath with feeling of abdominal bloating and also leg swelling.  He works as a Programmer, systems.  He has a long history of tobacco abuse, but has been cutting back recently, denies any excessive alcohol use.  He does eat fast food on a regular basis.  Also reported history of cardiomyopathy in his mother, LVEF 30%.  He was recently treated for pneumonia per PCP.  On examination this morning he is sitting in a bedside chair, reports no shortness of breath or chest pain at rest.  He is afebrile, heart rate in the 90s in sinus rhythm by telemetry which I personally reviewed (PVCs noted).  Blood pressure  105/87.  Lungs exhibit scattered rhonchi mainly on the right, no wheezing.  Cardiac exam with indistinct PMI, RRR with ectopy, no gallop.  Abdomen protuberant but nontender.  Mild ankle edema noted.  Lab work shows potassium 3.9,  BUN 21, creatinine 0.9, high-sensitivity troponin I flat in the 60s and nondiagnostic for ACS, hemoglobin 14.7, platelets 302.  SARS coronavirus 2 test negative.  Echocardiogram from October 13 showed LVEF 20 to 25% with restrictive diastolic filling pattern and diffuse hypokinesis, normal RV contraction, moderately dilated left atrium, mildly sclerotic aortic valve without stenosis, mildly elevated estimated PASP.  ECG from October 12 showed sinus tachycardia with PVCs, repolarization abnormalities, and poor R wave progression, rule out old anterior infarct pattern.  Patient presents with cardiomyopathy, LVEF 20 to 25%, restrictive diastolic filling pattern and diffuse hypokinesis.  Possibly nonischemic etiology although further testing will be required as an outpatient once medical therapy stabilized.  Plan at this point is to reduce Lasix to once daily dosing, initiate Aldactone 12.5 mg daily, and Coreg 3.125 mg twice daily.  Can consider adding Lanoxin and then ultimately ARB/ANRI depending on blood pressure.  Anticipate referral to the advanced heart failure clinic as an outpatient.  Satira Sark, M.D., F.A.C.C.

## 2019-05-18 NOTE — TOC Initial Note (Signed)
Transition of Care [facility]) - Initial/Assessment Note    Patient Details  Name: Logan Kennedy MRN: [id number] Date of Birth: [birth year]  Transition of Care [facility]) CM/SW Contact:    Ihor Gully, LCSW Phone Number: 05/18/2019, 1:59 PM  Clinical Narrative:                 Patient from home with spouse. Admitted for CHF. Patient does not take daily weights. 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  He smokes. Patient has PCP.  TOC will follow through discharge to adhere to address barriers and needs that arise.   Expected Discharge Plan: Home/Self Care Barriers to Discharge: Continued Medical Work up   Patient Goals and CMS Choice        Expected Discharge Plan and Services Expected Discharge Plan: Home/Self Care                                              Prior Living Arrangements/Services   Lives with:: Spouse Patient language and need for interpreter reviewed:: No Do you feel safe going back to the place where you live?: Yes      Need for Family Participation in Patient Care: Yes (Comment) Care giver support system in place?: No (comment)   Criminal Activity/Legal Involvement Pertinent to Current Situation/Hospitalization: No - Comment as needed  Activities of Daily Living Home Assistive Devices/Equipment: None ADL Screening (condition at time of admission) Patient's cognitive ability adequate to safely complete daily activities?: Yes Is the patient deaf or have difficulty hearing?: No Does the patient have difficulty seeing, even when wearing glasses/contacts?: No Does the patient have difficulty concentrating, remembering, or making decisions?: No Patient able to express need for assistance with ADLs?: Yes Does the patient have difficulty dressing or bathing?: No Independently performs ADLs?: Yes (appropriate for developmental age) Does the patient have difficulty walking or climbing stairs?: No Weakness of Legs: None Weakness of  Arms/Hands: None  Permission Sought/Granted                  Emotional Assessment Appearance:: Appears stated age   Affect (typically observed): Appropriate, Unable to Assess Orientation: : Oriented to Self, Oriented to Place, Oriented to  Time, Oriented to Situation Alcohol / Substance Use: Tobacco Use Psych Involvement: No (comment)  Admission diagnosis:  Systolic congestive heart failure, unspecified HF chronicity ([place]) [I50.20] Patient Active Problem List   Diagnosis Date Noted  . Lobar pneumonia ([place]) 05/17/2019  . Tobacco abuse 05/17/2019  . Acute systolic CHF (congestive heart failure) ([place]) 05/17/2019  . Transaminasemia   . Acute CHF (congestive heart failure) ([place]) 05/16/2019  . CAP (community acquired pneumonia) 05/16/2019  . Hypertensive urgency 05/16/2019  . Liver disease 05/16/2019   PCP:  Redmond School, MD Pharmacy:   [id number] - [place], [address] AT [address] [place] [address] Phone: [phone] Fax: [place] [place] [address] Phone: [phone] Fax: [phone]     Social Determinants of Health (SDOH) Interventions    Readmission Risk Interventions No flowsheet data found.

## 2019-05-19 DIAGNOSIS — R7989 Other specified abnormal findings of blood chemistry: Secondary | ICD-10-CM

## 2019-05-19 DIAGNOSIS — I5021 Acute systolic (congestive) heart failure: Secondary | ICD-10-CM

## 2019-05-19 LAB — COMPREHENSIVE METABOLIC PANEL
ALT: 75 U/L — ABNORMAL HIGH (ref 0–44)
AST: 59 U/L — ABNORMAL HIGH (ref 15–41)
Albumin: 4 g/dL (ref 3.5–5.0)
Alkaline Phosphatase: 79 U/L (ref 38–126)
Anion gap: 13 (ref 5–15)
BUN: 21 mg/dL — ABNORMAL HIGH (ref 6–20)
CO2: 32 mmol/L (ref 22–32)
Calcium: 9.7 mg/dL (ref 8.9–10.3)
Chloride: 96 mmol/L — ABNORMAL LOW (ref 98–111)
Creatinine, Ser: 0.92 mg/dL (ref 0.61–1.24)
GFR calc Af Amer: >60 mL/min (ref >60)
GFR calc non Af Amer: >60 mL/min (ref >60)
Glucose, Bld: 95 mg/dL (ref 70–99)
Potassium: 4.9 mmol/L (ref 3.5–5.1)
Sodium: 141 mmol/L (ref 135–145)
Total Bilirubin: 0.7 mg/dL (ref 0.3–1.2)
Total Protein: 7.8 g/dL (ref 6.5–8.1)

## 2019-05-19 LAB — LEGIONELLA PNEUMOPHILA SEROGP 1 UR AG: L. pneumophila Serogp 1 Ur Ag: NEGATIVE

## 2019-05-19 MED ORDER — DIGOXIN 125 MCG PO TABS
0.1250 mg | ORAL_TABLET | Freq: Every day | ORAL | Status: DC
  Administered 2019-05-19: 0.125 mg via ORAL
  Filled 2019-05-19: qty 1

## 2019-05-19 MED ORDER — SPIRONOLACTONE 25 MG PO TABS: 12.5000 mg | ORAL_TABLET | Freq: Every day | ORAL | 1 refills | Status: DC

## 2019-05-19 MED ORDER — GUAIFENESIN-DM 100-10 MG/5ML PO SYRP: 5.0000 mL | ORAL_SOLUTION | Freq: Four times a day (QID) | ORAL | 0 refills | Status: DC | PRN

## 2019-05-19 MED ORDER — CARVEDILOL 3.125 MG PO TABS: 3.1250 mg | ORAL_TABLET | Freq: Two times a day (BID) | ORAL | 2 refills | Status: DC

## 2019-05-19 MED ORDER — FUROSEMIDE 40 MG PO TABS: 40.0000 mg | ORAL_TABLET | Freq: Every day | ORAL | 2 refills | Status: DC

## 2019-05-19 MED ORDER — DIGOXIN 125 MCG PO TABS: 0.1250 mg | ORAL_TABLET | Freq: Every day | ORAL | 1 refills | Status: DC

## 2019-05-19 MED ORDER — FUROSEMIDE 40 MG PO TABS: 40.0000 mg | ORAL_TABLET | Freq: Every day | ORAL | Status: DC

## 2019-05-19 NOTE — Discharge Summary (Signed)
Physician Discharge Summary  Logan Kennedy [id number] DOB: 1965/12/24 DOA: 05/16/2019  PCP: Redmond School, MD  Admit date: 05/16/2019 Discharge date: 05/19/2019  Time spent: 35 minutes  Recommendations for Outpatient Follow-up:  1. Reassess patient volume status and further adjust diuretic regimen as needed 2. Repeat basic metabolic panel to follow electrolytes and renal function 3. Repeat LFTs to follow transaminitis. 4. Repeat chest x-ray in 4 weeks to assure resolution of the infiltrates.   Discharge Diagnoses:  Principal Problem:   Acute CHF (congestive heart failure) (HCC) Active Problems:   CAP (community acquired pneumonia)   Hypertensive urgency   Lobar pneumonia (HCC)   Tobacco abuse   Transaminasemia   Acute systolic CHF (congestive heart failure) (HCC)   Elevated LFTs   Discharge Condition: Stable and improved.  Discharged home with instruction to follow-up with cardiology/heart failure clinic and PCP  Diet recommendation: Heart healthy/low-sodium diet  Filed Weights   05/16/19 1619 05/19/19 0539  Weight: 116.6 kg 113.9 kg    History of present illness:  53 year old male with a history of tobacco abuse with no other documented medical problems presents with 1 month history of lower extremity edema and abdominal bloating type sensation.  He states that he has been having some progressive shortness of breath in the past week prior to admission with orthopnea symptoms.  He denies any fevers, chills, headache, neck pain, hemoptysis, nausea, vomiting, diarrhea.  He saw his primary care provider approximately 1 week prior to this admission.  Apparently chest x-ray was done and there was concern for pneumonia.  The patient was treated with amox/clav, but his coughing or shortness of breath continues.  Unfortunately, the patient continues to smoke.  He has smoked 1.5 packs/day for the last 30 years. In the emergency department, the patient was afebrile hemodynamically  stable with oxygen saturation 95 to 100% on room air.  WBC was 11.8.  BMP was unremarkable.  AST 54, ALT 73, alkaline phosphatase 65, total bilirubin 0.9.  Chest x-ray showed right lower lobe opacity with increased interstitial markings compared to 08/18/2016.  CT of the abdomen and pelvis showed hepatic steatosis with a right pleural effusion and thickened gallbladder wall.  Hospital Course:  1-acute systolic CHF -Appears to be nonischemic in nature -Patient denying chest pain -Euvolemic at time of discharge after IV diuresis -Echocardiogram demonstrating ejection fraction 20-25%, diffuse hypokinesis, mild mitral regurgitation/tricuspid regurgitation with mildly elevated right ventricle systolic pressure. -Cardiology service consulted and per recommendations patient will be discharged on Lanoxin, Lasix 40 mg daily, spironolactone and carvedilol. -Education about low-sodium diet and daily weights provided -Patient will be scheduled for outpatient follow-up with cardiology service and heart failure clinic. -Repeat echo in 3 months will be required to further decide on the need for ICD implantation. -Further evaluation and ischemia work-up including right and left heart cath to be decided by cardiology as an outpatient.  2-lobar pneumonia -Patient partially treated prior to admission with Augmentin -4 more days of Rocephin and Zithromax given while inpatient -Afebrile, no complaining of shortness of breath, normal WBCs and procalcitonin less than 0.10 -Recommend repeat chest x-ray in about 4 weeks to assure resolution of infiltrates.  3-essential hypertension -Elevated on presentation -Soft and is stable at time of discharge -Heart healthy/low-sodium diet discussed with patient -As mentioned above continue the use of Lasix, spironolactone and carvedilol.  4-elevated troponin -secondary to demand ischemia due to heart failure exacerbation -troponin trend was flat and nonspecific ST and T wave  changes appreciated on telemetry  and EKG -Continue medical management as per cardiology recommendation.  5-transaminitis -Right upper quadrant ultrasound demonstrated gallbladder wall thickening without ductal dilatation -Ascites secondary to hepatic acidosis and congestion secondary to CHF -Viral hepatitis panel negative -Repeat complete metabolic panel follow-up visit to reassess LFTs stability.  6-tobacco abuse -Cessation counseling provided -Patient is motivated to quit -Nicotine patch declined.  Procedures:  2D echo: 1. Left ventricular ejection fraction, by visual estimation, is 20 to 25%. The left ventricle has normal function. Moderately increased left ventricular size. Left ventricular septal wall thickness was normal. There is no left ventricular hypertrophy. 2. Left ventricular diastolic Doppler parameters are consistent with restrictive filling pattern of LV diastolic filling. 3. Diffuse hypokinesis. 4. Global right ventricle has normal systolic function.The right ventricular size is normal. No increase in right ventricular wall thickness. 5. Left atrial size was moderately dilated. 6. Right atrial size was normal. 7. The mitral valve is normal in structure. Mild mitral valve regurgitation. No evidence of mitral stenosis. 8. The tricuspid valve is normal in structure. Tricuspid valve regurgitation is mild. 9. The aortic valve is tricuspid Aortic valve regurgitation was not visualized by color flow Doppler. Mild aortic valve sclerosis without stenosis. 10. The pulmonic valve was normal in structure. Pulmonic valve regurgitation is not visualized by color flow Doppler. 11. Mildly elevated pulmonary artery systolic pressure. 12. The inferior vena cava is normal in size with greater than 50% respiratory variability, suggesting right atrial pressure of 3 mmHg.  Consultations:  Cardiology service  Discharge Exam: Vitals:   05/19/19 0539 05/19/19 1119  BP: 103/85    Pulse: 81 92  Resp: 20   Temp: 98.1 F (36.7 C)   SpO2: 93%     General: Afebrile.  Reports feeling back to baseline.  No chest pain, no shortness of breath, no dyspnea on exertion, no palpitations or orthopnea. Cardiovascular: S1-S2, no rubs, no gallops, no JVD on exam. Respiratory: Positive scattered rhonchi; no crackles, no wheezing, using accessory muscles; normal respiratory effort.  Good oxygen saturation on room air. Abdomen: Soft, nontender, nondistended, positive bowel sounds Extremities: No cyanosis, no clubbing; trace pedal edema appreciated on exam.  Discharge Instructions   Discharge Instructions    (HEART FAILURE PATIENTS) Call MD:  Anytime you have any of the following symptoms: 1) 3 pound weight gain in 24 hours or 5 pounds in 1 week 2) shortness of breath, with or without a dry hacking cough 3) swelling in the hands, feet or stomach 4) if you have to sleep on extra pillows at night in order to breathe.   Complete by: As directed    Diet - low sodium heart healthy   Complete by: As directed    Discharge instructions   Complete by: As directed    Take medications as prescribed Check your weight on daily basis Follow low-sodium diet (less than 2 g daily). Maintain adequate hydration Follow-up with cardiology service as instructed   Increase activity slowly   Complete by: As directed      Allergies as of 05/19/2019   No Known Allergies     Medication List    STOP taking these medications   amoxicillin-clavulanate 875-125 MG tablet Commonly known as: AUGMENTIN     TAKE these medications   carvedilol 3.125 MG tablet Commonly known as: COREG Take 1 tablet (3.125 mg total) by mouth 2 (two) times daily with a meal.   digoxin 0.125 MG tablet Commonly known as: LANOXIN Take 1 tablet (0.125 mg total) by mouth daily.  Start taking on: May 20, 2019   furosemide 40 MG tablet Commonly known as: LASIX Take 1 tablet (40 mg total) by mouth daily. Start  taking on: May 20, 2019   guaiFENesin-dextromethorphan 100-10 MG/5ML syrup Commonly known as: ROBITUSSIN DM Take 5 mLs by mouth every 6 (six) hours as needed for cough.   mometasone 50 MCG/ACT nasal spray Commonly known as: NASONEX Place 2 sprays into the nose daily.   spironolactone 25 MG tablet Commonly known as: ALDACTONE Take 0.5 tablets (12.5 mg total) by mouth daily. Start taking on: May 20, 2019      No Known Allergies Follow-up Information    Bensimhon, Shaune Pascal, MD Follow up on 05/27/2019.   Specialty: Cardiology Why: Hospital Follow-Up on 05/27/2019 at 10:00. Will be at the [facility] in [place]. Initial Visit will be with a Nurse Practitioner. Parking Code is Associate Professor information: [place] [address] [phone]        Redmond School, MD. Schedule an appointment as soon as possible for a visit in 10 day(s).   Specialty: Internal Medicine Contact information: [address] [place] [id number] [phone]        Satira Sark, MD .   Specialty: Cardiology Contact information: [place] [address] [phone]           The results of significant diagnostics from this hospitalization (including imaging, microbiology, ancillary and laboratory) are listed below for reference.    Significant Diagnostic Studies: Ct Abdomen Pelvis W Contrast  Result Date: 05/16/2019 CLINICAL DATA:  53 year old male with abdominal pain and bloating. EXAM: CT ABDOMEN AND PELVIS WITH CONTRAST TECHNIQUE: Multidetector CT imaging of the abdomen and pelvis was performed using the standard protocol following bolus administration of intravenous contrast. CONTRAST:  155mL OMNIPAQUE IOHEXOL 300 MG/ML  SOLN COMPARISON:  None. FINDINGS: Lower chest: Partially visualized moderate right pleural effusion with associated partial compressive atelectasis of the right lung base. Pneumonia is  not excluded. Clinical correlation is recommended. There is mild cardiomegaly. Partially visualized small pericardial effusion measuring approximately 10 mm in thickness. There is consolidative changes of the visualized right middle lobe. No intra-abdominal free air. Small free fluid within the pelvis. Hepatobiliary: Apparent fatty infiltration of the liver. No intrahepatic biliary ductal dilatation. There is no calcified gallstone. There is diffuse thickening of the gallbladder wall with pericholecystic stranding and trace fluid. This may be related to underlying liver disease. Ultrasound may provide better evaluation of the gallbladder. Correlation with liver function test recommended. Pancreas: Unremarkable. No pancreatic ductal dilatation or surrounding inflammatory changes. Spleen: Normal in size without focal abnormality. Adrenals/Urinary Tract: Left adrenal thickening/hyperplasia. The right adrenal gland is grossly unremarkable. There is a 2 mm nonobstructing left renal inferior pole calculus. No hydronephrosis. There is no hydronephrosis or nephrolithiasis on the right. There is symmetric enhancement and excretion of contrast by both kidneys. The visualized ureters and urinary bladder appear unremarkable. Stomach/Bowel: Mild thickened appearance of the distal stomach, likely related to underdistention. Slight haziness of the fat surrounding the duodenal C-loop, likely related to underlying liver disease. Clinical correlation is recommended to exclude duodenitis. There is no bowel obstruction. Appendectomy. Vascular/Lymphatic: Mild aortoiliac atherosclerotic disease. The IVC is unremarkable. No portal venous gas. There is no adenopathy. Reproductive: The prostate and seminal vesicles are grossly unremarkable. No pelvic mass. Other: Small fat containing umbilical hernia. Musculoskeletal: No acute or significant osseous findings. IMPRESSION: 1. Fatty liver with findings suggestive of underlying liver  disease or  steatohepatitis. Correlation with LFTs recommended. 2. Thickened appearance of the gallbladder wall likely related to underlying liver disease. Ultrasound may provide better evaluation of the gallbladder. 3. Mild haziness of the fat surrounding the duodenum, likely related to hepatic enteropathy. Clinical correlation is recommended to exclude duodenitis. No bowel obstruction. 4. Partially visualized moderate right pleural effusion with associated partial compressive atelectasis of the right lung base. Pneumonia is not excluded. Clinical correlation is recommended. 5. Aortic Atherosclerosis (ICD10-I70.0). Electronically Signed   By: Anner Crete M.D.   On: 05/16/2019 21:50   Dg Chest Portable 1 View  Result Date: 05/16/2019 CLINICAL DATA:  Shortness of breath EXAM: PORTABLE CHEST 1 VIEW COMPARISON:  August 18, 2016. FINDINGS: There is airspace opacity in the right lower lung region. The lungs elsewhere are clear. Heart is mildly enlarged with pulmonary vascularity within normal limits. No adenopathy. No bone lesions. IMPRESSION: Airspace opacity consistent with pneumonia right lower lung region. Lungs elsewhere clear. Mild cardiac prominence. No adenopathy evident. Followup PA and lateral chest radiographs recommended in 3-4 weeks following trial of antibiotic therapy to ensure resolution and exclude underlying malignancy. Electronically Signed   By: Lowella Grip III M.D.   On: 05/16/2019 22:00   Dg Abd Acute 2+v W 1v Chest  Result Date: 05/10/2019 CLINICAL DATA:  Abdominal pain, generalized abdominal pain for 1 month EXAM: DG ABDOMEN ACUTE W/ 1V CHEST COMPARISON:  08/18/2016 FINDINGS: Right lower lobe airspace disease in the chest.  Mildly enlarged. No free air beneath either right or left hemidiaphragm. No signs of bowel obstruction. No acute bone finding. IMPRESSION: Right lower lobe pneumonia. Follow-up is suggested to ensure resolution. No acute intra-abdominal finding. These results will be  called to the ordering clinician or representative by the Radiologist Assistant, and communication documented in the PACS or zVision Dashboard. Electronically Signed   By: Zetta Bills M.D.   On: 05/10/2019 08:38   US Abdomen Limited Ruq  Result Date: 05/17/2019 CLINICAL DATA:  Abnormal lab values. EXAM: ULTRASOUND ABDOMEN LIMITED RIGHT UPPER QUADRANT COMPARISON:  CT abdomen pelvis 05/16/2019 FINDINGS: Gallbladder: The gallbladder wall is mildly thickened measuring 0.5 cm. No gallstones visualized. No sonographic Murphy sign noted by sonographer. Common bile duct: Diameter: 0.5 cm Liver: No focal lesion identified. Within normal limits in parenchymal echogenicity. Portal vein is patent on color Doppler imaging with normal direction of blood flow towards the liver. Other: Right pleural effusion. IMPRESSION: Mild gallbladder wall thickening which is a nonspecific finding and can be seen in the setting of fluid overload states, hypoalbuminemia, liver pathology such as cirrhosis, and gallbladder inflammation, among others. No other findings to suggest acute cholecystitis. Electronically Signed   By: Audie Pinto M.D.   On: 05/17/2019 14:47    Microbiology: Recent Results (from the past 240 hour(s))  SARS CORONAVIRUS 2 (TAT 6-24 HRS) Nasopharyngeal Nasopharyngeal Swab     Status: None   Collection Time: 05/16/19 11:37 PM   Specimen: Nasopharyngeal Swab  Result Value Ref Range Status   SARS Coronavirus 2 NEGATIVE NEGATIVE Final    Comment: (NOTE) SARS-CoV-2 target nucleic acids are NOT DETECTED. The SARS-CoV-2 RNA is generally detectable in upper and lower respiratory specimens during the acute phase of infection. Negative results do not preclude SARS-CoV-2 infection, do not rule out co-infections with other pathogens, and should not be used as the sole basis for treatment or other patient management decisions. Negative results must be combined with clinical observations, patient history, and  epidemiological information. The expected result  is Negative. Fact Sheet for Patients: SugarRoll.be Fact Sheet for Healthcare Providers: https://www.woods-mathews.com/ This test is not yet approved or cleared by the [place] FDA and  has been authorized for detection and/or diagnosis of SARS-CoV-2 by FDA under an Emergency Use Authorization (EUA). This EUA will remain  in effect (meaning this test can be used) for the duration of the COVID-19 declaration under Section 56 4(b)(1) of the Act, 21 U.S.C. section 360bbb-3(b)(1), unless the authorization is terminated or revoked sooner. Performed at [facility], [place] [address] [place], [place] [address]      Labs: Basic Metabolic Panel: Recent Labs  Lab 05/16/19 1811 05/17/19 0434 05/18/19 0539  NA 138 142 141  K 4.1 4.0 3.9  CL 104 104 99  CO2 26 28 31   GLUCOSE 101* 96 112*  BUN 27* 23* 21*  CREATININE 0.97 0.84 0.90  CALCIUM 8.6* 9.0 9.2  MG  --  2.2  --    Liver Function Tests: Recent Labs  Lab 05/16/19 1811 05/17/19 0434  AST 52* 54*  ALT 68* 73*  ALKPHOS 64 65  BILITOT 0.8 0.9  PROT 7.0 7.3  ALBUMIN 3.8 3.8   Recent Labs  Lab 05/16/19 1811  LIPASE 51   CBC: Recent Labs  Lab 05/16/19 1811 05/18/19 0539  WBC 11.8* 11.2*  HGB 14.1 14.7  HCT 46.2 49.4  MCV 96.9 97.8  PLT 289 302    BNP (last 3 results) Recent Labs    05/16/19 1811  BNP 607.0*    Signed:  Barton Dubois MD.  [facility] 05/19/2019, 12:10 PM

## 2019-05-19 NOTE — Progress Notes (Addendum)
Progress Note  Patient Name: Logan Kennedy Date of Encounter: 05/19/2019  Primary Cardiologist: Rozann Lesches, MD   Subjective   He reports feeling back to baseline. No chest pain, dyspnea, or palpitations. Abdominal distension resolved. Anxious to go home.   Inpatient Medications    Scheduled Meds: . azithromycin  500 mg Oral Daily  . carvedilol  3.125 mg Oral BID WC  . enoxaparin (LOVENOX) injection  40 mg Subcutaneous Q24H  . furosemide  40 mg Intravenous Daily  . guaiFENesin  1,200 mg Oral BID  . sodium chloride flush  3 mL Intravenous Q12H  . spironolactone  12.5 mg Oral Daily   Continuous Infusions: . sodium chloride    . cefTRIAXone (ROCEPHIN)  IV Stopped (05/19/19 0132)   PRN Meds: sodium chloride, acetaminophen, guaiFENesin-dextromethorphan, hydrALAZINE, ipratropium-albuterol, ondansetron (ZOFRAN) IV, sodium chloride flush   Vital Signs    Vitals:   05/17/19 2045 05/18/19 0543 05/18/19 2159 05/19/19 0539  BP: 124/90 105/87 125/84 103/85  Pulse: (!) 104 91 96 81  Resp: 20 18 20 20   Temp: 98.4 F (36.9 C) 97.8 F (36.6 C) 98.1 F (36.7 C) 98.1 F (36.7 C)  TempSrc: Oral Oral Oral Oral  SpO2: 92% 90% 94% 93%  Weight:    113.9 kg  Height:        Intake/Output Summary (Last 24 hours) at 05/19/2019 1031 Last data filed at 05/19/2019 0700 Gross per 24 hour  Intake 1140 ml  Output 1750 ml  Net -610 ml    Last 3 Weights 05/19/2019 05/16/2019 11/14/2014  Weight (lbs) 251 lb 257 lb 250 lb  Weight (kg) 113.853 kg 116.574 kg 113.399 kg      Telemetry    NSR, HR in 90's to low-100's. Episodes of ventricular trigeminy and bigeminy at times. Episodes of 3 beats NSVT - Personally Reviewed  ECG    No new tracings.   Physical Exam   General: Well developed, well nourished, male appearing in no acute distress. Head: Normocephalic, atraumatic.  Neck: Supple without bruits, JVD at 8cm. Lungs:  Resp regular and unlabored, mild rales along right base.  Heart: RRR with frequent ectopic beats, S1, S2, no S3, S4, or murmur; no rub. Abdomen: Soft, non-tender, non-distended with normoactive bowel sounds. No hepatomegaly. No rebound/guarding. No obvious abdominal masses. Extremities: No clubbing, cyanosis, or lower extremity edema. Distal pedal pulses are 2+ bilaterally. Neuro: Alert and oriented X 3. Moves all extremities spontaneously. Psych: Normal affect.  Labs    Chemistry Recent Labs  Lab 05/16/19 1811 05/17/19 0434 05/18/19 0539  NA 138 142 141  K 4.1 4.0 3.9  CL 104 104 99  CO2 26 28 31   GLUCOSE 101* 96 112*  BUN 27* 23* 21*  CREATININE 0.97 0.84 0.90  CALCIUM 8.6* 9.0 9.2  PROT 7.0 7.3  --   ALBUMIN 3.8 3.8  --   AST 52* 54*  --   ALT 68* 73*  --   ALKPHOS 64 65  --   BILITOT 0.8 0.9  --   GFRNONAA >60 >60 >60  GFRAA >60 >60 >60  ANIONGAP 8 10 11      Hematology Recent Labs  Lab 05/16/19 1811 05/18/19 0539  WBC 11.8* 11.2*  RBC 4.77 5.05  HGB 14.1 14.7  HCT 46.2 49.4  MCV 96.9 97.8  MCH 29.6 29.1  MCHC 30.5 29.8*  RDW 13.9 14.3  PLT 289 302    Cardiac EnzymesNo results for input(s): TROPONINI in the last 168 hours. No  results for input(s): TROPIPOC in the last 168 hours.   BNP Recent Labs  Lab 05/16/19 1811  BNP 607.0*     DDimer No results for input(s): DDIMER in the last 168 hours.   Radiology    US Abdomen Limited Ruq  Result Date: 05/17/2019 CLINICAL DATA:  Abnormal lab values. EXAM: ULTRASOUND ABDOMEN LIMITED RIGHT UPPER QUADRANT COMPARISON:  CT abdomen pelvis 05/16/2019 FINDINGS: Gallbladder: The gallbladder wall is mildly thickened measuring 0.5 cm. No gallstones visualized. No sonographic Murphy sign noted by sonographer. Common bile duct: Diameter: 0.5 cm Liver: No focal lesion identified. Within normal limits in parenchymal echogenicity. Portal vein is patent on color Doppler imaging with normal direction of blood flow towards the liver. Other: Right pleural effusion. IMPRESSION: Mild  gallbladder wall thickening which is a nonspecific finding and can be seen in the setting of fluid overload states, hypoalbuminemia, liver pathology such as cirrhosis, and gallbladder inflammation, among others. No other findings to suggest acute cholecystitis. Electronically Signed   By: Audie Pinto M.D.   On: 05/17/2019 14:47    Cardiac Studies   Echocardiogram: 05/17/2019 IMPRESSIONS   1. Left ventricular ejection fraction, by visual estimation, is 20 to 25%. The left ventricle has normal function. Moderately increased left ventricular size. Left ventricular septal wall thickness was normal. There is no left ventricular hypertrophy.  2. Left ventricular diastolic Doppler parameters are consistent with restrictive filling pattern of LV diastolic filling.  3. Diffuse hypokinesis.  4. Global right ventricle has normal systolic function.The right ventricular size is normal. No increase in right ventricular wall thickness.  5. Left atrial size was moderately dilated.  6. Right atrial size was normal.  7. The mitral valve is normal in structure. Mild mitral valve regurgitation. No evidence of mitral stenosis.  8. The tricuspid valve is normal in structure. Tricuspid valve regurgitation is mild.  9. The aortic valve is tricuspid Aortic valve regurgitation was not visualized by color flow Doppler. Mild aortic valve sclerosis without stenosis. 10. The pulmonic valve was normal in structure. Pulmonic valve regurgitation is not visualized by color flow Doppler. 11. Mildly elevated pulmonary artery systolic pressure. 12. The inferior vena cava is normal in size with greater than 50% respiratory variability, suggesting right atrial pressure of 3 mmHg.  Patient Profile     53 y.o. male with past medical history of tobacco use and no prior cardiac history who is currently admitted for PNA and acute CHF exacerbation, found to have a new cardiomyopathy with EF at 20-25%.   Assessment & Plan    1.  Acute Systolic CHF Exacerbation/New Cardiomyopathy - Presented with worsening dyspnea, abdominal distention and lower extremity edema, found to have a new cardiomyopathy with EF reduced to 20 to 25%. No prior imaging available for comparison.  - he was on Lasix 40mg  BID at the time of admission (transitioned to once daily dosing on 10/14) and while I&O's have not been recorded, his weight has declined by 6 lbs to 251 lbs today. Reports this is lower than his previous baseline weight of 260 lbs. Will transition from IV Lasix to PO Lasix 40mg  daily. - medical therapy has been limited secondary to low-normal BP. He was started on Coreg 3.125mg  BID along with Spironolactone 12.5mg  daily yesterday. BP 103/85 on most recent check but he denies any associated lightheadedness, dizziness or presyncope. Discussed with Dr. Domenic Kennedy and will add low-dose Digoxin. Unable to add ACE-I/ARB or to consider Entresto at this time. Given his frequent ventricular ectopy, can  hopefully titrate BB therapy at follow-up. He will need a repeat BMET and Dig Level at follow-up. An appointment has been made with the [facility].  2. Elevated LFT's -  AST 54 and ALT 73 on admission. Abdominal ultrasound without acute findings. Will recheck CMET today. Mild elevation most consistent with hepatic congestion.   3. PNA - was treated with Azithromycin and Rocephin this admission. Further management per admitting team.   4. Tobacco Use - he has a 60+ pack-year history. Has reduced his use to 3-4 cigarettes per day. Cessation advised.    For questions or updates, please contact Logan Kennedy Please consult [email or web address] for contact info under Cardiology/STEMI.   Signed, Logan Kennedy , PA-C 10:31 AM 05/19/2019 Pager: [phone]   Attending note:  Hospital course since yesterday's consultation reviewed and case discussed with Ms. Delano Metz, I agree with her above documentation and  recommendations.  Mr. Kice is anxious to go home today.  We have already scheduled an evaluation in the advanced heart failure clinic for next week.  He states that he feels much better and has improved clinically with diuresis.  Currently, relatively low blood pressure limits further advancement of medical therapy.  Would discharge on Coreg, Lanoxin, and spironolactone.  Lasix will be at 40 mg daily for now.  He needs to weigh himself daily.  Hopefully, low-dose ARB or Entresto can be considered next.  Although nonischemic etiology is possible with diffuse LV hypokinesis and restrictive diastolic filling pattern, anticipated right and left heart catheterization and then further advancing medical therapy as tolerated.  Satira Sark, M.D., F.A.C.C.

## 2019-05-19 NOTE — Progress Notes (Signed)
Discharge paperwork and instructions given to patient. All questions answered and patient stated his understand of continued care and needs required of him for CHF, appointments and medication compliance.  Elodia Florence RN 05/19/2019 1440

## 2019-05-26 ENCOUNTER — Other Ambulatory Visit: Payer: Self-pay

## 2019-05-26 ENCOUNTER — Ambulatory Visit (HOSPITAL_COMMUNITY)
Admission: RE | Admit: 2019-05-26 | Discharge: 2019-05-26 | Disposition: A | Discharge status: Home / Self Care | Payer: Commercial Managed Care - PPO | Source: Ambulatory Visit | Attending: Adult Health | Admitting: Adult Health

## 2019-05-26 ENCOUNTER — Other Ambulatory Visit (HOSPITAL_COMMUNITY): Payer: Self-pay | Admitting: *Deleted

## 2019-05-26 ENCOUNTER — Encounter (HOSPITAL_COMMUNITY): Payer: Self-pay

## 2019-05-26 VITALS — BP 118/74 | HR 103 | Wt 249.0 lb

## 2019-05-26 DIAGNOSIS — Z720 Tobacco use: Secondary | ICD-10-CM

## 2019-05-26 DIAGNOSIS — Z8249 Family history of ischemic heart disease and other diseases of the circulatory system: Secondary | ICD-10-CM | POA: Diagnosis not present

## 2019-05-26 DIAGNOSIS — I5022 Chronic systolic (congestive) heart failure: Secondary | ICD-10-CM

## 2019-05-26 DIAGNOSIS — I110 Hypertensive heart disease with heart failure: Secondary | ICD-10-CM | POA: Diagnosis not present

## 2019-05-26 DIAGNOSIS — G4730 Sleep apnea, unspecified: Secondary | ICD-10-CM

## 2019-05-26 DIAGNOSIS — F17210 Nicotine dependence, cigarettes, uncomplicated: Secondary | ICD-10-CM | POA: Diagnosis not present

## 2019-05-26 DIAGNOSIS — G4733 Obstructive sleep apnea (adult) (pediatric): Secondary | ICD-10-CM | POA: Diagnosis not present

## 2019-05-26 DIAGNOSIS — Z825 Family history of asthma and other chronic lower respiratory diseases: Secondary | ICD-10-CM | POA: Insufficient documentation

## 2019-05-26 DIAGNOSIS — I429 Cardiomyopathy, unspecified: Secondary | ICD-10-CM

## 2019-05-26 DIAGNOSIS — Z79899 Other long term (current) drug therapy: Secondary | ICD-10-CM | POA: Insufficient documentation

## 2019-05-26 DIAGNOSIS — I5021 Acute systolic (congestive) heart failure: Secondary | ICD-10-CM

## 2019-05-26 LAB — CBC
HCT: 46.2 % (ref 39.0–52.0)
Hemoglobin: 14.4 g/dL (ref 13.0–17.0)
MCH: 30 pg (ref 26.0–34.0)
MCHC: 31.2 g/dL (ref 30.0–36.0)
MCV: 96.3 fL (ref 80.0–100.0)
Platelets: 259 10*3/uL (ref 150–400)
RBC: 4.8 MIL/uL (ref 4.22–5.81)
RDW: 13.3 % (ref 11.5–15.5)
WBC: 9 10*3/uL (ref 4.0–10.5)
nRBC: 0 % (ref 0.0–0.2)

## 2019-05-26 LAB — BASIC METABOLIC PANEL
Anion gap: 12 (ref 5–15)
BUN: 21 mg/dL — ABNORMAL HIGH (ref 6–20)
CO2: 26 mmol/L (ref 22–32)
Calcium: 9 mg/dL (ref 8.9–10.3)
Chloride: 101 mmol/L (ref 98–111)
Creatinine, Ser: 0.75 mg/dL (ref 0.61–1.24)
GFR calc Af Amer: >60 mL/min (ref >60)
GFR calc non Af Amer: >60 mL/min (ref >60)
Glucose, Bld: 117 mg/dL — ABNORMAL HIGH (ref 70–99)
Potassium: 4.1 mmol/L (ref 3.5–5.1)
Sodium: 139 mmol/L (ref 135–145)

## 2019-05-26 LAB — PROTIME-INR
INR: 1.1 (ref 0.8–1.2)
Prothrombin Time: 14.1 seconds (ref 11.4–15.2)

## 2019-05-26 LAB — DIGOXIN LEVEL: Digoxin Level: <0.2 ng/mL — ABNORMAL LOW (ref 0.8–2.0)

## 2019-05-26 MED ORDER — SODIUM CHLORIDE 0.9% FLUSH: 3.0000 mL | Freq: Two times a day (BID) | INTRAVENOUS | Status: DC

## 2019-05-26 NOTE — Progress Notes (Addendum)
ADVANCED HF CLINIC CONSULT NOTE  Referring Physician: Dr Domenic Polite Primary Care: Dr Gerarda Fraction Primary Cardiologist: Dr Domenic Polite   HPI: [name] is being referred to the HF clinic by Dr Domenic Polite for HF consultation.   [name] is a 53 year old with a history of recently diagnosed acute systolic heart failure, HTN, smoker, OSA, and recent pneumonia.   Mom has heart failure.   2 months prior to admit he was unable to sleep in the bed. He was sleeping in a kneeling position.   Admitted to [facility]/16/19 with increased dyspnea in the setting of CAP. Treated with antibiotics.  ECHO completed and showed reduced LVEF 20%. Cardiology was consulted. Diuresed with IV lasix 20 pounds. Started on low dose coreg, spiro, digoxin, and lasix. No ischemic work up.   Today he is feeling much better. Mild SOB with exertion.  Denies PND/Orthopnea. Appetite ok. No fever or chills. Weight at home 248-249  pounds. Taking all medications. Smoking 2-3 cigarettes per day. He is a long distance Administrator.   Cardiac Testing  ECHO 05/17/19 EF LVEF 15-20% RV moderately reduced.  Review of Systems: [y] = yes, [ ]  = no   General: Weight gain [ ] ; Weight loss [Y ]; Anorexia [ ] ; Fatigue [Y ]; Fever [ ] ; Chills [ ] ; Weakness [ ]   Cardiac: Chest pain/pressure [ ] ; Resting SOB [ ] ; Exertional SOB [ Y]; Orthopnea [ ] ; Pedal Edema [ ] ; Palpitations [ ] ; Syncope [ ] ; Presyncope [ ] ; Paroxysmal nocturnal dyspnea[ ]   Pulmonary: Cough [ ] ; Wheezing[ ] ; Hemoptysis[ ] ; Sputum [ ] ; Snoring [ ]   GI: Vomiting[ ] ; Dysphagia[ ] ; Melena[ ] ; Hematochezia [ ] ; Heartburn[ ] ; Abdominal pain [ ] ; Constipation [ ] ; Diarrhea [ ] ; BRBPR [ ]   GU: Hematuria[ ] ; Dysuria [ ] ; Nocturia[ ]   Vascular: Pain in legs with walking [ ] ; Pain in feet with lying flat [ ] ; Non-healing sores [ ] ; Stroke [ ] ; TIA [ ] ; Slurred speech [ ] ;  Neuro: Headaches[ ] ; Vertigo[ ] ; Seizures[ ] ; Paresthesias[ ] ;Blurred vision [ ] ; Diplopia [ ] ; Vision changes [ ]    Ortho/Skin: Arthritis [ ] ; Joint pain [ Y]; Muscle pain [ ] ; Joint swelling [ ] ; Back Pain [Y ]; Rash [ ]   Psych: Depression[ ] ; Anxiety[ ]   Heme: Bleeding problems [ ] ; Clotting disorders [ ] ; Anemia [ ]   Endocrine: Diabetes [ ] ; Thyroid dysfunction[ ]    Past Medical History:  Diagnosis Date  . Tobacco use     Current Outpatient Medications  Medication Sig Dispense Refill  . carvedilol (COREG) 3.125 MG tablet Take 1 tablet (3.125 mg total) by mouth 2 (two) times daily with a meal. 60 tablet 2  . digoxin (LANOXIN) 0.125 MG tablet Take 1 tablet (0.125 mg total) by mouth daily. 30 tablet 1  . furosemide (LASIX) 40 MG tablet Take 1 tablet (40 mg total) by mouth daily. 30 tablet 2  . guaiFENesin-dextromethorphan (ROBITUSSIN DM) 100-10 MG/5ML syrup Take 5 mLs by mouth every 6 (six) hours as needed for cough. 118 mL 0  . mometasone (NASONEX) 50 MCG/ACT nasal spray Place 2 sprays into the nose daily.     Marland Kitchen spironolactone (ALDACTONE) 25 MG tablet Take 0.5 tablets (12.5 mg total) by mouth daily. 30 tablet 1   No current facility-administered medications for this encounter.     No Known Allergies    Social History   Socioeconomic History  . Marital status: Married    Spouse name: Not  on file  . Number of children: Not on file  . Years of education: Not on file  . Highest education level: Not on file  Occupational History  . Not on file  Social Needs  . Financial resource strain: Not on file  . Food insecurity    Worry: Not on file    Inability: Not on file  . Transportation needs    Medical: Not on file    Non-medical: Not on file  Tobacco Use  . Smoking status: Current Every Day Smoker    Packs/day: 1.50    Years: 33.00    Pack years: 49.50    Types: Cigarettes  . Smokeless tobacco: Never Used  Substance and Sexual Activity  . Alcohol use: No  . Drug use: No  . Sexual activity: Not on file  Lifestyle  . Physical activity    Days per week: Not on file    Minutes per  session: Not on file  . Stress: Not on file  Relationships  . Social Herbalist on phone: Not on file    Gets together: Not on file    Attends religious service: Not on file    Active member of club or organization: Not on file    Attends meetings of clubs or organizations: Not on file    Relationship status: Not on file  . Intimate partner violence    Fear of current or ex partner: Not on file    Emotionally abused: Not on file    Physically abused: Not on file    Forced sexual activity: Not on file  Other Topics Concern  . Not on file  Social History Narrative  . Not on file      Family History  Problem Relation Age of Onset  . Heart failure Mother   . COPD Father     Vitals:   05/26/19 1455  BP: 118/74  Pulse: (!) 103  SpO2: 94%  Weight: 112.9 kg (249 lb)   Wt Readings from Last 3 Encounters:  05/26/19 112.9 kg (249 lb)  05/19/19 113.9 kg (251 lb)  11/14/14 113.4 kg (250 lb)    PHYSICAL EXAM: General:  Well appearing. No respiratory difficulty HEENT: normal Neck: supple. no JVD. Carotids 2+ bilat; no bruits. No lymphadenopathy or thryomegaly appreciated. Cor: PMI nondisplaced. Regular rate & rhythm. No rubs, or murmurs.  Lungs: clear Abdomen: soft, nontender, nondistended. No hepatosplenomegaly. No bruits or masses. Good bowel sounds. Extremities: no cyanosis, clubbing, rash, edema Neuro: alert & oriented x 3, cranial nerves grossly intact. moves all 4 extremities w/o difficulty. Affect pleasant.  ECG: Sinus Tach 101 QRS 106 ms    ASSESSMENT & PLAN:  1. Chronic Systolic HF ECHO [id number] EF 15% with RV dysfunction. ? Viral Cardiomyopathy. Will eventually need CMRI Set up for LHC/RHC to evaluate coronaries and hemodynamics.  NYHA III. Volume status stable. Continue lasix 40 mg daily -Continue low dose carvedilol  -Continue digoxin 0.125 mg daily. - Continue 12.5 mg spironolactone daily.  - Check pre cath labs.   2. OSA Diagnosed years  ago. Set up for home sleep study. Need to get CPAP set back up.   3. Smoker  Discussed smoking cessation.   LHC/RHC next week.   Follow up in 3 weeks.   Amy Clegg NP-C  3:41 PM  Patient seen and examined with the above-signed Advanced Practice Provider and/or Housestaff. I personally reviewed laboratory data, imaging studies and relevant notes. I independently examined the  patient and formulated the important aspects of the plan. I have edited the note to reflect any of my changes or salient points. I have personally discussed the plan with the patient and/or family.  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 Recently admitted with new-onset severe systolic HF. Echo with LVEF 15-20% and moderate to severe RV dysfunction. (Personally reviewed) Now feeling much better with medical therapy. Long discussion with him and his wife about possible etiologies. Will plan R/L HC next week. If no CAD with need cMRI. Schedule home sleep study to re-evaluate OSA. We discussed the fact that he is not eligible for CDL with EF < 40%. Cath next week. Continue to titrate HF meds.   On exam General:  Well appearing. No resp difficulty HEENT: normal Neck: supple. no JVD. Carotids 2+ bilat; no bruits. No lymphadenopathy or thryomegaly appreciated. Cor: PMI nondisplaced. Mildly tachy regular. No obvious s3 Lungs: clear Abdomen: obese soft, nontender, nondistended. No hepatosplenomegaly. No bruits or masses. Good bowel sounds. Extremities: no cyanosis, clubbing, rash, edema Neuro: alert & orientedx3, cranial nerves grossly intact. moves all 4 extremities w/o difficulty. Affect pleasant  Glori Bickers, MD  3:53 PM

## 2019-05-26 NOTE — Patient Instructions (Addendum)
Labs done today  Heart Catheterization on Mon 10/26, see instructions below  Your provider has recommended that you have a home sleep study.  [employer] is the company that does these test.  They will contact you by phone and must speak with you before they can ship the equipment.  Once they have spoken with you they will send the equipment right to your home with instructions on how to set it up.  Once you have completed the test you just dispose of the equipment, the information is automatically uploaded to [place] via [employer] technology.  IF you have any questions or issues with the equipment please call the company directly at [phone].  If your test is positive for sleep apnea and you need a home CPAP machine you will be contacted by Dr Theodosia Blender office [facility]) to set this up.  Your physician recommends that you schedule a follow-up appointment in: 3 weeks  At the [facility], you and your health needs are our priority. As part of our continuing mission to provide you with exceptional heart care, we have created designated Provider Care Teams. These Care Teams include your primary Cardiologist (physician) and Advanced Practice Providers (APPs- Physician Assistants and Nurse Practitioners) who all work together to provide you with the care you need, when you need it.   If you have any questions or concerns before your next appointment please send [place] a message through [place] or call our office at [phone].  You may see any of the following providers on your designated Care Team at your next follow up: Marland Kitchen Dr Glori Bickers . Dr Loralie Champagne . Darrick Grinder, NP . Lyda Jester, PA   Please be sure to bring in all your medications bottles to every appointment.    You are scheduled for a Cardiac Catheterization on Monday, October 26 with Dr. Glori Bickers.  1. Please arrive at the [facility] (Main Entrance A) at [facility]: [address]  [place], [place] [address] at 9:30 AM (This time is two hours before your procedure to ensure your preparation). Free valet parking service is available.   Special note: Every effort is made to have your procedure done on time. Please understand that emergencies sometimes delay scheduled procedures.  2. Diet: Do not eat solid foods after midnight.  The patient may have clear liquids until 5am upon the day of the procedure.  3. Covid Testing, please go to [facility] for this tomorrow  4. Medication instructions in preparation for your procedure:   DO NOT TAKE FUROSEMIDE OR SPIRONOLACTONE MON AM   On the morning of your procedure, take any morning medicines NOT listed above.  You may use sips of water.  5. Plan for one night stay--bring personal belongings. 6. Bring a current list of your medications and current insurance cards. 7. You MUST have a responsible person to drive you home. 8. Someone MUST be with you the first 24 hours after you arrive home or your discharge will be delayed. 9. Please wear clothes that are easy to get on and off and wear slip-on shoes.  Thank you for allowing [place] to care for you!   -- [facility]

## 2019-05-26 NOTE — H&P (View-Only) (Signed)
ADVANCED HF CLINIC CONSULT NOTE  Referring Physician: Dr Domenic Polite Primary Care: Dr Gerarda Fraction Primary Cardiologist: Dr Domenic Polite   HPI: Logan Kennedy is being referred to the HF clinic by Dr Domenic Polite for HF consultation.   Logan Kennedy is a 53 year old with a history of recently diagnosed acute systolic heart failure, HTN, smoker, OSA, and recent pneumonia.   Mom has heart failure.   2 months prior to admit he was unable to sleep in the bed. He was sleeping in a kneeling position.   Admitted to [facility] 05/16/19 with increased dyspnea in the setting of CAP. Treated with antibiotics.  ECHO completed and showed reduced LVEF 20%. Cardiology was consulted. Diuresed with IV lasix 20 pounds. Started on low dose coreg, spiro, digoxin, and lasix. No ischemic work up.   Today he is feeling much better. Mild SOB with exertion.  Denies PND/Orthopnea. Appetite ok. No fever or chills. Weight at home 248-249  pounds. Taking all medications. Smoking 2-3 cigarettes per day. He is a long distance Administrator.   Cardiac Testing  ECHO 05/17/19 EF LVEF 15-20% RV moderately reduced.  Review of Systems: [y] = yes, [ ]  = no   General: Weight gain [ ] ; Weight loss [Y ]; Anorexia [ ] ; Fatigue [Y ]; Fever [ ] ; Chills [ ] ; Weakness [ ]   Cardiac: Chest pain/pressure [ ] ; Resting SOB [ ] ; Exertional SOB [ Y]; Orthopnea [ ] ; Pedal Edema [ ] ; Palpitations [ ] ; Syncope [ ] ; Presyncope [ ] ; Paroxysmal nocturnal dyspnea[ ]   Pulmonary: Cough [ ] ; Wheezing[ ] ; Hemoptysis[ ] ; Sputum [ ] ; Snoring [ ]   GI: Vomiting[ ] ; Dysphagia[ ] ; Melena[ ] ; Hematochezia [ ] ; Heartburn[ ] ; Abdominal pain [ ] ; Constipation [ ] ; Diarrhea [ ] ; BRBPR [ ]   GU: Hematuria[ ] ; Dysuria [ ] ; Nocturia[ ]   Vascular: Pain in legs with walking [ ] ; Pain in feet with lying flat [ ] ; Non-healing sores [ ] ; Stroke [ ] ; TIA [ ] ; Slurred speech [ ] ;  Neuro: Headaches[ ] ; Vertigo[ ] ; Seizures[ ] ; Paresthesias[ ] ;Blurred vision [ ] ; Diplopia [ ] ; Vision changes [ ]    Ortho/Skin: Arthritis [ ] ; Joint pain [ Y]; Muscle pain [ ] ; Joint swelling [ ] ; Back Pain [Y ]; Rash [ ]   Psych: Depression[ ] ; Anxiety[ ]   Heme: Bleeding problems [ ] ; Clotting disorders [ ] ; Anemia [ ]   Endocrine: Diabetes [ ] ; Thyroid dysfunction[ ]    Past Medical History:  Diagnosis Date  . Tobacco use     Current Outpatient Medications  Medication Sig Dispense Refill  . carvedilol (COREG) 3.125 MG tablet Take 1 tablet (3.125 mg total) by mouth 2 (two) times daily with a meal. 60 tablet 2  . digoxin (LANOXIN) 0.125 MG tablet Take 1 tablet (0.125 mg total) by mouth daily. 30 tablet 1  . furosemide (LASIX) 40 MG tablet Take 1 tablet (40 mg total) by mouth daily. 30 tablet 2  . guaiFENesin-dextromethorphan (ROBITUSSIN DM) 100-10 MG/5ML syrup Take 5 mLs by mouth every 6 (six) hours as needed for cough. 118 mL 0  . mometasone (NASONEX) 50 MCG/ACT nasal spray Place 2 sprays into the nose daily.     Marland Kitchen spironolactone (ALDACTONE) 25 MG tablet Take 0.5 tablets (12.5 mg total) by mouth daily. 30 tablet 1   No current facility-administered medications for this encounter.     No Known Allergies    Social History   Socioeconomic History  . Marital status: Married    Spouse name: Not  on file  . Number of children: Not on file  . Years of education: Not on file  . Highest education level: Not on file  Occupational History  . Not on file  Social Needs  . Financial resource strain: Not on file  . Food insecurity    Worry: Not on file    Inability: Not on file  . Transportation needs    Medical: Not on file    Non-medical: Not on file  Tobacco Use  . Smoking status: Current Every Day Smoker    Packs/day: 1.50    Years: 33.00    Pack years: 49.50    Types: Cigarettes  . Smokeless tobacco: Never Used  Substance and Sexual Activity  . Alcohol use: No  . Drug use: No  . Sexual activity: Not on file  Lifestyle  . Physical activity    Days per week: Not on file    Minutes per  session: Not on file  . Stress: Not on file  Relationships  . Social Herbalist on phone: Not on file    Gets together: Not on file    Attends religious service: Not on file    Active member of club or organization: Not on file    Attends meetings of clubs or organizations: Not on file    Relationship status: Not on file  . Intimate partner violence    Fear of current or ex partner: Not on file    Emotionally abused: Not on file    Physically abused: Not on file    Forced sexual activity: Not on file  Other Topics Concern  . Not on file  Social History Narrative  . Not on file      Family History  Problem Relation Age of Onset  . Heart failure Mother   . COPD Father     Vitals:   05/26/19 1455  BP: 118/74  Pulse: (!) 103  SpO2: 94%  Weight: 112.9 kg (249 lb)   Wt Readings from Last 3 Encounters:  05/26/19 112.9 kg (249 lb)  05/19/19 113.9 kg (251 lb)  11/14/14 113.4 kg (250 lb)    PHYSICAL EXAM: General:  Well appearing. No respiratory difficulty HEENT: normal Neck: supple. no JVD. Carotids 2+ bilat; no bruits. No lymphadenopathy or thryomegaly appreciated. Cor: PMI nondisplaced. Regular rate & rhythm. No rubs, or murmurs.  Lungs: clear Abdomen: soft, nontender, nondistended. No hepatosplenomegaly. No bruits or masses. Good bowel sounds. Extremities: no cyanosis, clubbing, rash, edema Neuro: alert & oriented x 3, cranial nerves grossly intact. moves all 4 extremities w/o difficulty. Affect pleasant.  ECG: Sinus Tach 101 QRS 106 ms    ASSESSMENT & PLAN:  1. Chronic Systolic HF ECHO [id number] EF 15% with RV dysfunction. ? Viral Cardiomyopathy. Will eventually need CMRI Set up for LHC/RHC to evaluate coronaries and hemodynamics.  NYHA III. Volume status stable. Continue lasix 40 mg daily -Continue low dose carvedilol  -Continue digoxin 0.125 mg daily. - Continue 12.5 mg spironolactone daily.  - Check pre cath labs.   2. OSA Diagnosed years  ago. Set up for home sleep study. Need to get CPAP set back up.   3. Smoker  Discussed smoking cessation.   LHC/RHC next week.   Follow up in 3 weeks.   Amy Clegg NP-C  3:41 PM  Patient seen and examined with the above-signed Advanced Practice Provider and/or Housestaff. I personally reviewed laboratory data, imaging studies and relevant notes. I independently examined the  patient and formulated the important aspects of the plan. I have edited the note to reflect any of my changes or salient points. I have personally discussed the plan with the patient and/or family.  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 Recently admitted with new-onset severe systolic HF. Echo with LVEF 15-20% and moderate to severe RV dysfunction. (Personally reviewed) Now feeling much better with medical therapy. Long discussion with him and his wife about possible etiologies. Will plan R/L HC next week. If no CAD with need cMRI. Schedule home sleep study to re-evaluate OSA. We discussed the fact that he is not eligible for CDL with EF < 40%. Cath next week. Continue to titrate HF meds.   On exam General:  Well appearing. No resp difficulty HEENT: normal Neck: supple. no JVD. Carotids 2+ bilat; no bruits. No lymphadenopathy or thryomegaly appreciated. Cor: PMI nondisplaced. Mildly tachy regular. No obvious s3 Lungs: clear Abdomen: obese soft, nontender, nondistended. No hepatosplenomegaly. No bruits or masses. Good bowel sounds. Extremities: no cyanosis, clubbing, rash, edema Neuro: alert & orientedx3, cranial nerves grossly intact. moves all 4 extremities w/o difficulty. Affect pleasant  Glori Bickers, MD  3:53 PM

## 2019-05-27 ENCOUNTER — Other Ambulatory Visit (HOSPITAL_COMMUNITY)
Admission: RE | Admit: 2019-05-27 | Discharge: 2019-05-27 | Disposition: A | Discharge status: Home / Self Care | Payer: Commercial Managed Care - PPO | Source: Ambulatory Visit | Attending: Internal Medicine | Admitting: Internal Medicine

## 2019-05-27 ENCOUNTER — Other Ambulatory Visit (HOSPITAL_COMMUNITY): Payer: Self-pay

## 2019-05-27 ENCOUNTER — Inpatient Hospital Stay (HOSPITAL_COMMUNITY): Payer: Commercial Managed Care - PPO

## 2019-05-27 DIAGNOSIS — Z20828 Contact with and (suspected) exposure to other viral communicable diseases: Secondary | ICD-10-CM | POA: Diagnosis not present

## 2019-05-27 DIAGNOSIS — Z01812 Encounter for preprocedural laboratory examination: Secondary | ICD-10-CM | POA: Insufficient documentation

## 2019-05-27 LAB — SARS CORONAVIRUS 2 (TAT 6-24 HRS): SARS Coronavirus 2: NEGATIVE

## 2019-05-27 NOTE — Progress Notes (Signed)
Opened in error

## 2019-05-30 ENCOUNTER — Encounter (HOSPITAL_COMMUNITY)
Admission: RE | Disposition: A | Discharge status: Home / Self Care | Payer: Self-pay | Source: Home / Self Care | Attending: Internal Medicine

## 2019-05-30 ENCOUNTER — Ambulatory Visit (HOSPITAL_COMMUNITY)
Admission: RE | Admit: 2019-05-30 | Discharge: 2019-05-30 | Disposition: A | Discharge status: Home / Self Care | Payer: Commercial Managed Care - PPO | Attending: Internal Medicine | Admitting: Internal Medicine

## 2019-05-30 ENCOUNTER — Other Ambulatory Visit: Payer: Self-pay

## 2019-05-30 DIAGNOSIS — I5022 Chronic systolic (congestive) heart failure: Secondary | ICD-10-CM | POA: Diagnosis not present

## 2019-05-30 DIAGNOSIS — I110 Hypertensive heart disease with heart failure: Secondary | ICD-10-CM | POA: Insufficient documentation

## 2019-05-30 DIAGNOSIS — F17210 Nicotine dependence, cigarettes, uncomplicated: Secondary | ICD-10-CM | POA: Insufficient documentation

## 2019-05-30 DIAGNOSIS — Z79899 Other long term (current) drug therapy: Secondary | ICD-10-CM | POA: Insufficient documentation

## 2019-05-30 DIAGNOSIS — Z9119 Patient's noncompliance with other medical treatment and regimen: Secondary | ICD-10-CM | POA: Diagnosis not present

## 2019-05-30 DIAGNOSIS — I428 Other cardiomyopathies: Secondary | ICD-10-CM | POA: Diagnosis not present

## 2019-05-30 DIAGNOSIS — Z8249 Family history of ischemic heart disease and other diseases of the circulatory system: Secondary | ICD-10-CM | POA: Insufficient documentation

## 2019-05-30 DIAGNOSIS — I2510 Atherosclerotic heart disease of native coronary artery without angina pectoris: Secondary | ICD-10-CM | POA: Insufficient documentation

## 2019-05-30 DIAGNOSIS — G4733 Obstructive sleep apnea (adult) (pediatric): Secondary | ICD-10-CM | POA: Insufficient documentation

## 2019-05-30 DIAGNOSIS — R0602 Shortness of breath: Secondary | ICD-10-CM | POA: Insufficient documentation

## 2019-05-30 HISTORY — PX: RIGHT/LEFT HEART CATH AND CORONARY ANGIOGRAPHY: CATH118266

## 2019-05-30 LAB — POCT I-STAT 7, (LYTES, BLD GAS, ICA,H+H)
Acid-Base Excess: 2 mmol/L (ref 0.0–2.0)
Bicarbonate: 29.4 mmol/L — ABNORMAL HIGH (ref 20.0–28.0)
Calcium, Ion: 1.2 mmol/L (ref 1.15–1.40)
HCT: 45 % (ref 39.0–52.0)
Hemoglobin: 15.3 g/dL (ref 13.0–17.0)
O2 Saturation: 86 %
Potassium: 4.2 mmol/L (ref 3.5–5.1)
Sodium: 142 mmol/L (ref 135–145)
TCO2: 31 mmol/L (ref 22–32)
pCO2 arterial: 54.8 mmHg — ABNORMAL HIGH (ref 32.0–48.0)
pH, Arterial: 7.337 — ABNORMAL LOW (ref 7.350–7.450)
pO2, Arterial: 55 mmHg — ABNORMAL LOW (ref 83.0–108.0)

## 2019-05-30 LAB — POCT I-STAT EG7
Acid-Base Excess: 2 mmol/L (ref 0.0–2.0)
Bicarbonate: 27 mmol/L (ref 20.0–28.0)
Bicarbonate: 29.9 mmol/L — ABNORMAL HIGH (ref 20.0–28.0)
Calcium, Ion: 0.97 mmol/L — ABNORMAL LOW (ref 1.15–1.40)
Calcium, Ion: 1.26 mmol/L (ref 1.15–1.40)
HCT: 41 % (ref 39.0–52.0)
HCT: 45 % (ref 39.0–52.0)
Hemoglobin: 13.9 g/dL (ref 13.0–17.0)
Hemoglobin: 15.3 g/dL (ref 13.0–17.0)
O2 Saturation: 55 %
O2 Saturation: 62 %
Potassium: 3.4 mmol/L — ABNORMAL LOW (ref 3.5–5.1)
Potassium: 4.2 mmol/L (ref 3.5–5.1)
Sodium: 142 mmol/L (ref 135–145)
Sodium: 143 mmol/L (ref 135–145)
TCO2: 29 mmol/L (ref 22–32)
TCO2: 32 mmol/L (ref 22–32)
pCO2, Ven: 53.3 mmHg (ref 44.0–60.0)
pCO2, Ven: 56.6 mmHg (ref 44.0–60.0)
pH, Ven: 7.314 (ref 7.250–7.430)
pH, Ven: 7.331 (ref 7.250–7.430)
pO2, Ven: 32 mmHg (ref 32.0–45.0)
pO2, Ven: 35 mmHg (ref 32.0–45.0)

## 2019-05-30 SURGERY — RIGHT/LEFT HEART CATH AND CORONARY ANGIOGRAPHY
Anesthesia: LOCAL

## 2019-05-30 MED ORDER — HEPARIN SODIUM (PORCINE) 1000 UNIT/ML IJ SOLN
INTRAMUSCULAR | Status: AC
  Filled 2019-05-30: qty 1

## 2019-05-30 MED ORDER — IOHEXOL 350 MG/ML SOLN
INTRAVENOUS | Status: DC | PRN
  Administered 2019-05-30: 40 mL

## 2019-05-30 MED ORDER — MIDAZOLAM HCL 2 MG/2ML IJ SOLN
INTRAMUSCULAR | Status: AC
  Filled 2019-05-30: qty 2

## 2019-05-30 MED ORDER — SODIUM CHLORIDE 0.9 % IV SOLN: INTRAVENOUS | Status: DC

## 2019-05-30 MED ORDER — SODIUM CHLORIDE 0.9% FLUSH: 3.0000 mL | INTRAVENOUS | Status: DC | PRN

## 2019-05-30 MED ORDER — LIDOCAINE HCL (PF) 1 % IJ SOLN
INTRAMUSCULAR | Status: AC
  Filled 2019-05-30: qty 30

## 2019-05-30 MED ORDER — VERAPAMIL HCL 2.5 MG/ML IV SOLN
INTRAVENOUS | Status: DC | PRN
  Administered 2019-05-30: 10 mL via INTRA_ARTERIAL

## 2019-05-30 MED ORDER — HYDRALAZINE HCL 20 MG/ML IJ SOLN: 10.0000 mg | INTRAMUSCULAR | Status: DC | PRN

## 2019-05-30 MED ORDER — LABETALOL HCL 5 MG/ML IV SOLN: 10.0000 mg | INTRAVENOUS | Status: DC | PRN

## 2019-05-30 MED ORDER — HEPARIN (PORCINE) IN NACL 1000-0.9 UT/500ML-% IV SOLN
INTRAVENOUS | Status: AC
  Filled 2019-05-30: qty 1000

## 2019-05-30 MED ORDER — FENTANYL CITRATE (PF) 100 MCG/2ML IJ SOLN
INTRAMUSCULAR | Status: AC
  Filled 2019-05-30: qty 2

## 2019-05-30 MED ORDER — SODIUM CHLORIDE 0.9 % IV SOLN: 250.0000 mL | INTRAVENOUS | Status: DC | PRN

## 2019-05-30 MED ORDER — MIDAZOLAM HCL 2 MG/2ML IJ SOLN
INTRAMUSCULAR | Status: DC | PRN
  Administered 2019-05-30: 2 mg via INTRAVENOUS

## 2019-05-30 MED ORDER — ASPIRIN 81 MG PO CHEW
81.0000 mg | CHEWABLE_TABLET | ORAL | Status: AC
  Administered 2019-05-30: 12:00:00 81 mg via ORAL
  Filled 2019-05-30: qty 1

## 2019-05-30 MED ORDER — LIDOCAINE HCL (PF) 1 % IJ SOLN
INTRAMUSCULAR | Status: DC | PRN
  Administered 2019-05-30 (×2): 2 mL

## 2019-05-30 MED ORDER — SODIUM CHLORIDE 0.9% FLUSH: 3.0000 mL | Freq: Two times a day (BID) | INTRAVENOUS | Status: DC

## 2019-05-30 MED ORDER — ACETAMINOPHEN 325 MG PO TABS: 650.0000 mg | ORAL_TABLET | ORAL | Status: DC | PRN

## 2019-05-30 MED ORDER — HEPARIN (PORCINE) IN NACL 1000-0.9 UT/500ML-% IV SOLN
INTRAVENOUS | Status: DC | PRN
  Administered 2019-05-30 (×2): 500 mL

## 2019-05-30 MED ORDER — FENTANYL CITRATE (PF) 100 MCG/2ML IJ SOLN
INTRAMUSCULAR | Status: DC | PRN
  Administered 2019-05-30: 25 ug via INTRAVENOUS

## 2019-05-30 MED ORDER — ONDANSETRON HCL 4 MG/2ML IJ SOLN: 4.0000 mg | Freq: Four times a day (QID) | INTRAMUSCULAR | Status: DC | PRN

## 2019-05-30 MED ORDER — HEPARIN SODIUM (PORCINE) 1000 UNIT/ML IJ SOLN
INTRAMUSCULAR | Status: DC | PRN
  Administered 2019-05-30: 5000 [IU] via INTRAVENOUS

## 2019-05-30 MED ORDER — VERAPAMIL HCL 2.5 MG/ML IV SOLN
INTRAVENOUS | Status: AC
  Filled 2019-05-30: qty 2

## 2019-05-30 SURGICAL SUPPLY — 9 items
CATH 5FR JL3.5 JR4 ANG PIG MP (CATHETERS) ×2 IMPLANT
CATH BALLN WEDGE 5F 110CM (CATHETERS) ×2 IMPLANT
DEVICE RAD COMP TR BAND LRG (VASCULAR PRODUCTS) ×2 IMPLANT
GLIDESHEATH SLEND SS 6F .021 (SHEATH) ×2 IMPLANT
GUIDEWIRE INQWIRE 1.5J.035X260 (WIRE) ×1 IMPLANT
INQWIRE 1.5J .035X260CM (WIRE) ×2
PACK CARDIAC CATHETERIZATION (CUSTOM PROCEDURE TRAY) ×2 IMPLANT
SHEATH GLIDE SLENDER 4/5FR (SHEATH) ×2 IMPLANT
TRANSDUCER W/STOPCOCK (MISCELLANEOUS) ×2 IMPLANT

## 2019-05-30 NOTE — Discharge Instructions (Signed)
Radial Site Care ° °This sheet gives you information about how to care for yourself after your procedure. Your health care provider may also give you more specific instructions. If you have problems or questions, contact your health care provider. °What can I expect after the procedure? °After the procedure, it is common to have: °· Bruising and tenderness at the catheter insertion area. °Follow these instructions at home: °Medicines °· Take over-the-counter and prescription medicines only as told by your health care provider. °Insertion site care °· Follow instructions from your health care provider about how to take care of your insertion site. Make sure you: °? Wash your hands with soap and water before you change your bandage (dressing). If soap and water are not available, use hand sanitizer. °? Change your dressing as told by your health care provider. °? Leave stitches (sutures), skin glue, or adhesive strips in place. These skin closures may need to stay in place for 2 weeks or longer. If adhesive strip edges start to loosen and curl up, you may trim the loose edges. Do not remove adhesive strips completely unless your health care provider tells you to do that. °· Check your insertion site every day for signs of infection. Check for: °? Redness, swelling, or pain. °? Fluid or blood. °? Pus or a bad smell. °? Warmth. °· Do not take baths, swim, or use a hot tub until your health care provider approves. °· You may shower 24-48 hours after the procedure, or as directed by your health care provider. °? Remove the dressing and gently wash the site with plain soap and water. °? Pat the area dry with a clean towel. °? Do not rub the site. That could cause bleeding. °· Do not apply powder or lotion to the site. °Activity ° °· For 24 hours after the procedure, or as directed by your health care provider: °? Do not flex or bend the affected arm. °? Do not push or pull heavy objects with the affected arm. °? Do not  drive yourself home from the hospital or clinic. You may drive 24 hours after the procedure unless your health care provider tells you not to. °? Do not operate machinery or power tools. °· Do not lift anything that is heavier than 10 lb (4.5 kg), or the limit that you are told, until your health care provider says that it is safe. °· Ask your health care provider when it is okay to: °? Return to work or school. °? Resume usual physical activities or sports. °? Resume sexual activity. °General instructions °· If the catheter site starts to bleed, raise your arm and put firm pressure on the site. If the bleeding does not stop, get help right away. This is a medical emergency. °· If you went home on the same day as your procedure, a responsible adult should be with you for the first 24 hours after you arrive home. °· Keep all follow-up visits as told by your health care provider. This is important. °Contact a health care provider if: °· You have a fever. °· You have redness, swelling, or yellow drainage around your insertion site. °Get help right away if: °· You have unusual pain at the radial site. °· The catheter insertion area swells very fast. °· The insertion area is bleeding, and the bleeding does not stop when you hold steady pressure on the area. °· Your arm or hand becomes pale, cool, tingly, or numb. °These symptoms may represent a serious problem   that is an emergency. Do not wait to see if the symptoms will go away. Get medical help right away. Call your local emergency services (911 in the [place] Do not drive yourself to the hospital. °Summary °· After the procedure, it is common to have bruising and tenderness at the site. °· Follow instructions from your health care provider about how to take care of your radial site wound. Check the wound every day for signs of infection. °· Do not lift anything that is heavier than 10 lb (4.5 kg), or the limit that you are told, until your health care provider says  that it is safe. °This information is not intended to replace advice given to you by your health care provider. Make sure you discuss any questions you have with your health care provider. °Document Released: 08/23/2010 Document Revised: 08/26/2017 Document Reviewed: 08/26/2017 °Elsevier Patient Education © 2020 Elsevier Inc[employer] ° °

## 2019-05-30 NOTE — Interval H&P Note (Signed)
History and Physical Interval Note:  05/30/2019 10:27 AM  Logan Kennedy  has presented today for surgery, with the diagnosis of heart failure.  The various methods of treatment have been discussed with the patient and family. After consideration of risks, benefits and other options for treatment, the patient has consented to  Procedure(s): RIGHT/LEFT HEART CATH AND CORONARY ANGIOGRAPHY (N/A) and possible coronary angioplasty as a surgical intervention.  The patient's history has been reviewed, patient examined, no change in status, stable for surgery.  I have reviewed the patient's chart and labs.  Questions were answered to the patient's satisfaction.     [name]

## 2019-05-31 ENCOUNTER — Encounter (HOSPITAL_COMMUNITY): Payer: Self-pay | Admitting: Internal Medicine

## 2019-06-20 ENCOUNTER — Ambulatory Visit (HOSPITAL_COMMUNITY)
Admission: RE | Admit: 2019-06-20 | Discharge: 2019-06-20 | Disposition: A | Discharge status: Home / Self Care | Payer: Commercial Managed Care - PPO | Source: Ambulatory Visit | Attending: Internal Medicine | Admitting: Internal Medicine

## 2019-06-20 ENCOUNTER — Encounter (HOSPITAL_COMMUNITY): Payer: Self-pay | Admitting: Internal Medicine

## 2019-06-20 ENCOUNTER — Other Ambulatory Visit: Payer: Self-pay

## 2019-06-20 ENCOUNTER — Other Ambulatory Visit (HOSPITAL_COMMUNITY): Payer: Self-pay

## 2019-06-20 VITALS — BP 130/82 | HR 102 | Wt 250.8 lb

## 2019-06-20 DIAGNOSIS — Z8249 Family history of ischemic heart disease and other diseases of the circulatory system: Secondary | ICD-10-CM | POA: Insufficient documentation

## 2019-06-20 DIAGNOSIS — F17210 Nicotine dependence, cigarettes, uncomplicated: Secondary | ICD-10-CM | POA: Insufficient documentation

## 2019-06-20 DIAGNOSIS — I428 Other cardiomyopathies: Secondary | ICD-10-CM | POA: Diagnosis not present

## 2019-06-20 DIAGNOSIS — I110 Hypertensive heart disease with heart failure: Secondary | ICD-10-CM | POA: Insufficient documentation

## 2019-06-20 DIAGNOSIS — I5022 Chronic systolic (congestive) heart failure: Secondary | ICD-10-CM

## 2019-06-20 DIAGNOSIS — R000 Tachycardia, unspecified: Secondary | ICD-10-CM | POA: Diagnosis not present

## 2019-06-20 DIAGNOSIS — I2510 Atherosclerotic heart disease of native coronary artery without angina pectoris: Secondary | ICD-10-CM | POA: Diagnosis not present

## 2019-06-20 DIAGNOSIS — G4730 Sleep apnea, unspecified: Secondary | ICD-10-CM

## 2019-06-20 DIAGNOSIS — I493 Ventricular premature depolarization: Secondary | ICD-10-CM | POA: Insufficient documentation

## 2019-06-20 DIAGNOSIS — G4733 Obstructive sleep apnea (adult) (pediatric): Secondary | ICD-10-CM | POA: Insufficient documentation

## 2019-06-20 DIAGNOSIS — Z79899 Other long term (current) drug therapy: Secondary | ICD-10-CM | POA: Diagnosis not present

## 2019-06-20 LAB — DIGOXIN LEVEL: Digoxin Level: 0.5 ng/mL — ABNORMAL LOW (ref 0.8–2.0)

## 2019-06-20 LAB — BASIC METABOLIC PANEL
Anion gap: 10 (ref 5–15)
BUN: 15 mg/dL (ref 6–20)
CO2: 26 mmol/L (ref 22–32)
Calcium: 9.6 mg/dL (ref 8.9–10.3)
Chloride: 101 mmol/L (ref 98–111)
Creatinine, Ser: 0.77 mg/dL (ref 0.61–1.24)
GFR calc Af Amer: >60 mL/min (ref >60)
GFR calc non Af Amer: >60 mL/min (ref >60)
Glucose, Bld: 106 mg/dL — ABNORMAL HIGH (ref 70–99)
Potassium: 4.2 mmol/L (ref 3.5–5.1)
Sodium: 137 mmol/L (ref 135–145)

## 2019-06-20 LAB — BRAIN NATRIURETIC PEPTIDE: B Natriuretic Peptide: 103.5 pg/mL — ABNORMAL HIGH (ref 0.0–100.0)

## 2019-06-20 MED ORDER — ENTRESTO 24-26 MG PO TABS: 1.0000 | ORAL_TABLET | Freq: Two times a day (BID) | ORAL | 3 refills | Status: DC

## 2019-06-20 MED ORDER — FUROSEMIDE 40 MG PO TABS: 40.0000 mg | ORAL_TABLET | Freq: Every day | ORAL | 2 refills | Status: DC | PRN

## 2019-06-20 NOTE — Progress Notes (Signed)
Height: 6'1"    Weight: 250 lbs BMI: 33  Today's Date: 06/20/2019  STOP BANG RISK ASSESSMENT S (snore) Have you been told that you snore?     YES   T (tired) Are you often tired, fatigued, or sleepy during the day?   YES  O (obstruction) Do you stop breathing, choke, or gasp during sleep? NO   P (pressure) Do you have or are you being treated for high blood pressure? YES   B (BMI) Is your body index greater than 35 kg/m? NO   A (age) Are you 55 years old or older? YES   N (neck) Do you have a neck circumference greater than 16 inches?      G (gender) Are you a male? YES   TOTAL STOP/BANG "YES" ANSWERS 5                                                                       For Office Use Only              Procedure Order Form    YES to 3+ Stop Bang questions OR two clinical symptoms - patient qualifies for WatchPAT (CPT 95800)      Clinical Notes: Will consult Sleep Specialist and refer for management of therapy due to patient increased risk of Sleep Apnea. Ordering a sleep study due to the following two clinical symptoms: Excessive daytime sleepiness G47.10 / Loud snoring R06.83  Which test do you need, WP1 or WP300???  . Do you have access to a smart device containing the app stores?  If YES, then -->WP1   If NO, then  --->WP300

## 2019-06-20 NOTE — Progress Notes (Signed)
Zio patch placed onto patient by Philicia Branch, CMA.  All instructions and information reviewed with patient, they verbalize understanding with no questions.

## 2019-06-20 NOTE — Telephone Encounter (Signed)
Faxed [employer] [place] [employer]ighter and notes to [employer]. Faxed to [phone].

## 2019-06-20 NOTE — Progress Notes (Signed)
ADVANCED HF CLINIC NOTE  Referring Physician: Dr Domenic Polite Primary Care: Dr Gerarda Fraction Primary Cardiologist: Dr Domenic Polite   HPI: [name] is being referred to the HF clinic by Dr Domenic Polite for HF consultation.   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   Admitted to [facility] 05/16/19 with increased dyspnea in the setting of CAP. Treated with antibiotics.  ECHO  showed reduced LVEF 20%. Cardiology was consulted. Diuresed with IV lasix 20 pounds.   Since we last saw him underwent R/L cath 05/30/19   Prox RCA lesion is 20% stenosed.  Ost LAD to Prox LAD lesion is 20% stenosed.  Mid LAD lesion is 20% stenosed.   Findings:  RA = 7 RV = 48/10 PA = 45/19 (32) PCW = 22 Fick cardiac output/index = 6.2/2.6 PVR = 1.8 WU FA sat = 86% PA sat = 57%, 62%  Assessment:  1. Minimal non-obstructive CAD 2. Severe NICM EF 20% (suspect PVC or HTN mediated) 3. Relatively well-compensated hemodynamics     Returns for routine f/u. Says he is feeling good. Weight stable 248-250. Denies SOB, orthopnea or PND. No edema. HAs not heard from sleep apnea company so went back to using his old device. No dizziness.   Cardiac Testing  ECHO 05/17/19 EF LVEF 15-20% RV moderately reduced.    Past Medical History:  Diagnosis Date  . Tobacco use     Current Outpatient Medications  Medication Sig Dispense Refill  . carvedilol (COREG) 3.125 MG tablet Take 1 tablet (3.125 mg total) by mouth 2 (two) times daily with a meal. 60 tablet 2  . digoxin (LANOXIN) 0.125 MG tablet Take 1 tablet (0.125 mg total) by mouth daily. 30 tablet 1  . furosemide (LASIX) 40 MG tablet Take 1 tablet (40 mg total) by mouth daily. 30 tablet 2  . spironolactone (ALDACTONE) 25 MG tablet Take 0.5 tablets (12.5 mg total) by mouth daily. 30 tablet 1   No current facility-administered medications for this encounter.     No  Known Allergies    Social History   Socioeconomic History  . Marital status: Married    Spouse name: Not on file  . Number of children: Not on file  . Years of education: Not on file  . Highest education level: Not on file  Occupational History  . Not on file  Social Needs  . Financial resource strain: Not on file  . Food insecurity    Worry: Not on file    Inability: Not on file  . Transportation needs    Medical: Not on file    Non-medical: Not on file  Tobacco Use  . Smoking status: Current Every Day Smoker    Packs/day: 1.50    Years: 33.00    Pack years: 49.50    Types: Cigarettes  . Smokeless tobacco: Never Used  Substance and Sexual Activity  . Alcohol use: No  . Drug use: No  . Sexual activity: Not on file  Lifestyle  . Physical activity    Days per week: Not on file    Minutes per session: Not on file  . Stress: Not on file  Relationships  . Social Herbalist on phone: Not on file    Gets together: Not on file    Attends religious service: Not on file    Active member of club or organization: Not on file  Attends meetings of clubs or organizations: Not on file    Relationship status: Not on file  . Intimate partner violence    Fear of current or ex partner: Not on file    Emotionally abused: Not on file    Physically abused: Not on file    Forced sexual activity: Not on file  Other Topics Concern  . Not on file  Social History Narrative  . Not on file      Family History  Problem Relation Age of Onset  . Heart failure Mother   . COPD Father     Vitals:   06/20/19 0932  BP: 130/82  Pulse: (!) 102  SpO2: 95%  Weight: 113.8 kg (250 lb 12.8 oz)   Wt Readings from Last 3 Encounters:  06/20/19 113.8 kg (250 lb 12.8 oz)  05/30/19 112.9 kg (249 lb)  05/26/19 112.9 kg (249 lb)    PHYSICAL EXAM: General:  Well appearing. No resp difficulty HEENT: normal Neck: supple. no JVD. Carotids 2+ bilat; no bruits. No lymphadenopathy or  thryomegaly appreciated. Cor: PMI nondisplaced. Regular mildly tachy occasional ectopy No rubs, gallops or murmurs. Lungs: clear Abdomen: soft, nontender, nondistended. No hepatosplenomegaly. No bruits or masses. Good bowel sounds. Extremities: no cyanosis, clubbing, rash, edema Neuro: alert & orientedx3, cranial nerves grossly intact. moves all 4 extremities w/o difficulty. Affect pleasant   ECG: Sinus Tach 100 QRS 104 ms Inferolateral TWI Personally reviewed   ASSESSMENT & PLAN:  1. Chronic Systolic HF - ECHO [id number] EF 15% with RV dysfunction. ? PVC or HTN - Cath 10/20 minimal CAD. Well compensated hemodynamics  - Improved NYHA II but still tachycardic with narrow pulse pressure on recent cath - Continue low dose carvedilol  -Continue digoxin 0.125 mg daily. - Continue 12.5 mg spironolactone daily.  - Add Entresto 24/26 bid - Refer to Pharm[facility] Clinic for ongoing med titration  - Check zio for PVC burden - F/u on OSA  2. CAD - minimal by cath 10/20 - add statin eventually  3. OSA - currently using old CPAP machine. Will need new test. We will arrange  4. Smoker  - has quit.   5. PVCs  - zio patch to quantify. Possible cause of CM  Glori Bickers MD 9:54 AM

## 2019-06-20 NOTE — Patient Instructions (Signed)
Decrease Furosemide to 40 mg AS NEEDED ONLY for weight of 252 lb or greater  Start Entresto 24/26 mg Twice daily  Labs done today, we will notify you for abnormal readings  Your provider has recommended that  you wear a Zio Patch for 3 days.  This monitor will record your heart rhythm for our review.  IF you have any symptoms while wearing the monitor please press the button.  If you have any issues with the patch or you notice a red or orange light on it please call the company at [phone].  Once you remove the patch please mail it back to the company as soon as possible so we can get the results.  Your provider has recommended that you have a home sleep study.  [employer] is the company that does these test.  They will contact you by phone and must speak with you before they can ship the equipment.  Once they have spoken with you they will send the equipment right to your home with instructions on how to set it up.  Once you have completed the test you just dispose of the equipment, the information is automatically uploaded to [place] via [employer]-tooth technology.  IF you have any questions or issues with the equipment please call the company directly at [phone].  If your test is positive for sleep apnea and you need a home CPAP machine you will be contacted by Dr Theodosia Blender office [facility]) to set this up.  Please follow up with our heart failure pharmacist in 2-3 weeks  Your physician recommends that you schedule a follow-up appointment in: 2 months with echocardiogram  If you have any questions or concerns before your next appointment please send [place] a message through [place] or call our office at [phone].  At the [facility], you and your health needs are our priority. As part of our continuing mission to provide you with exceptional heart care, we have created designated Provider Care Teams. These Care Teams include your primary Cardiologist (physician) and  Advanced Practice Providers (APPs- Physician Assistants and Nurse Practitioners) who all work together to provide you with the care you need, when you need it.   You may see any of the following providers on your designated Care Team at your next follow up: Marland Kitchen Dr Glori Bickers . Dr Loralie Champagne . Darrick Grinder, NP . Lyda Jester, PA   Please be sure to bring in all your medications bottles to every appointment.

## 2019-06-20 NOTE — Addendum Note (Signed)
Encounter addended by: Scarlette Calico, RN on: 06/20/2019 12:02 PM  Actions taken: Order list changed, Diagnosis association updated

## 2019-06-28 NOTE — Progress Notes (Signed)
Referring Physician: Dr Domenic Polite Primary Care: Dr Gerarda Fraction Primary Cardiologist: Dr Domenic Polite  HF Cardiologist: Dr. 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   Admitted to [facility] 05/16/19 with increased dyspnea in the setting of CAP. Treated with antibiotics.  ECHO  showed reduced LVEF 20%. Cardiology was consulted. Diuresed with IV lasix 20 pounds. RHC/LHC on that admission showed minimal non-obstructive CAD with well-compensated hemodynamics (RAP 7, PA 45/19 [mPAP 32] PCWP 22, CI 2.6, PVR 1.8).  ECHO 05/17/19 EF LVEF 15-20% RV moderately reduced.  Recently presented to HF Clinic for follow-up with Dr. Haroldine Laws on 06/20/19. At that visit he reported feeling well. Weight was stable at 248-250. Denied SOB, orthopnea or PND. No edema. Had not heard from sleep apnea company so went back to using his old device. No dizziness.   Today he returns to HF clinic for pharmacist medication titration. At last visit with MD, Delene Loll 24/26 mg BID was initiated. Overall he is feeling well today. Says he feels so much better since before he was admitted. No dizziness, lightheadedness, chest pain or palpitations. His breathing is good. No SOB/DOE. He is remaining active, walking 1-1.5 miles daily with his dog. His weight has been stable, ranging 149-152 lbs. Has not used any prn furosemide since Entresto started. No LEE, PND or orthopnea. Weight up 3 lbs since last office visit, JVD 3 cm above the clavicle. His appetite is good. He is trying to follow a low salt diet. He does not fliud restrict, but is drinking less fluid than before hospitalization.    . Shortness of breath/dyspnea on exertion? no  . Orthopnea/PND? no . Edema? no . Lightheadedness/dizziness? no . Daily weights at home? yes . Blood pressure/heart rate monitoring at home? no . Following low-sodium/fluid-restricted diet? Tries to  follow low salt diet; has not been fluid restricting.   HF Medications: Carvedilol 3.125 mg BID Entresto 24/26 mg BID Spironolactone 12.5 mg daily Digoxin 0.125 mg daily Furosemide 40 mg daily PRN  Has the patient been experiencing any side effects to the medications prescribed?  no  Does the patient have any problems obtaining medications due to transportation or finances?   No - has Pharmacist, community  Understanding of regimen: good Understanding of indications: good Potential of compliance: excellent - his wife is a Marine scientist and helps him remember his medications.  Patient understands to avoid NSAIDs. Patient understands to avoid decongestants.    Pertinent Lab Values: . Serum creatinine 0.75, BUN 14, Potassium 4.3, Sodium 137  Vital Signs: . Weight: 254 (last clinic weight: 250.8) . Blood pressure: 126/86  . Heart rate: 91   Assessment: 1. Chronic Systolic HF - ECHO [id number] EF 15% with RV dysfunction. ? PVC or HTN - Cath 10/20 minimal CAD. Well compensated hemodynamics  - NYHA class 2 symptoms - Volume status mildly elevated. Will hopefully improve with increase in Entresto. We discussed how to use PRN furosemide. Patient was educated on fluid weight gain and instructed to take prn furosemide if weight increases 3lb overnight or 5lbs in a week.  As well as if he has any swelling in feet/ankles, becomes more SOB or difficulty lying flat.  - Labs stable: Scr 0.75, K 4.3 - Continue furosemide 40 mg daily PRN - Continue carvedilol 3.125 mg BID - Increase Entresto to 49/51 mg BID. Repeat BMET in 2 weeks.  - Continue spironolactone 12.5 mg daily.  Will consider increasing at next visit.  - Continue digoxin 0.125 mg daily. Digoxin level 0.5 ng/mL on 06/20/19.  - Would be good candidate for SLGT2i in the future.  - F/u on OSA  2. CAD - minimal by cath 10/20 - add statin eventually  3. OSA - currently using old CPAP machine. Plan for new test tomorrow.   4. Smoker  -  has quit.   5. PVCs  - zio patch to quantify. Possible cause of CM  Plan: 1) Medication changes: Based on clinical presentation, vital signs and recent labs will increase Entresto to 49/51 mg BID.  2) Labs: Scr 0.75. K 4.3 3) Follow-up: Pharmacy Clinic in 2 weeks.    Audry Riles, PharmD, BCPS, BCCP, CPP Heart Failure Clinic Pharmacist [phone]

## 2019-07-05 ENCOUNTER — Ambulatory Visit (HOSPITAL_COMMUNITY)
Admission: RE | Admit: 2019-07-05 | Discharge: 2019-07-05 | Disposition: A | Discharge status: Home / Self Care | Payer: Commercial Managed Care - PPO | Source: Ambulatory Visit | Attending: Cardiology | Admitting: Cardiology

## 2019-07-05 ENCOUNTER — Other Ambulatory Visit: Payer: Self-pay

## 2019-07-05 ENCOUNTER — Encounter (HOSPITAL_COMMUNITY): Payer: Self-pay | Admitting: *Deleted

## 2019-07-05 VITALS — BP 126/86 | HR 91 | Wt 254.0 lb

## 2019-07-05 DIAGNOSIS — Z87891 Personal history of nicotine dependence: Secondary | ICD-10-CM | POA: Insufficient documentation

## 2019-07-05 DIAGNOSIS — I110 Hypertensive heart disease with heart failure: Secondary | ICD-10-CM | POA: Diagnosis not present

## 2019-07-05 DIAGNOSIS — I5023 Acute on chronic systolic (congestive) heart failure: Secondary | ICD-10-CM | POA: Diagnosis present

## 2019-07-05 DIAGNOSIS — I2510 Atherosclerotic heart disease of native coronary artery without angina pectoris: Secondary | ICD-10-CM | POA: Diagnosis not present

## 2019-07-05 DIAGNOSIS — G4733 Obstructive sleep apnea (adult) (pediatric): Secondary | ICD-10-CM | POA: Diagnosis not present

## 2019-07-05 DIAGNOSIS — Z79899 Other long term (current) drug therapy: Secondary | ICD-10-CM | POA: Insufficient documentation

## 2019-07-05 DIAGNOSIS — I5022 Chronic systolic (congestive) heart failure: Secondary | ICD-10-CM | POA: Insufficient documentation

## 2019-07-05 DIAGNOSIS — I493 Ventricular premature depolarization: Secondary | ICD-10-CM | POA: Diagnosis not present

## 2019-07-05 LAB — BASIC METABOLIC PANEL
Anion gap: 8 (ref 5–15)
BUN: 14 mg/dL (ref 6–20)
CO2: 26 mmol/L (ref 22–32)
Calcium: 9.1 mg/dL (ref 8.9–10.3)
Chloride: 103 mmol/L (ref 98–111)
Creatinine, Ser: 0.75 mg/dL (ref 0.61–1.24)
GFR calc Af Amer: >60 mL/min (ref >60)
GFR calc non Af Amer: >60 mL/min (ref >60)
Glucose, Bld: 113 mg/dL — ABNORMAL HIGH (ref 70–99)
Potassium: 4.3 mmol/L (ref 3.5–5.1)
Sodium: 137 mmol/L (ref 135–145)

## 2019-07-05 MED ORDER — ENTRESTO 49-51 MG PO TABS: 1.0000 | ORAL_TABLET | Freq: Two times a day (BID) | ORAL | 6 refills | Status: DC

## 2019-07-05 NOTE — Patient Instructions (Signed)
It was a pleasure seeing you today!  MEDICATIONS: -We are changing your medications today -Increase  Entresto to 49/51 mg (1 tab) twice daily. You may take 2 tablets of the 24/26 mg strength twice daily until you pick up the new prescription. -Call if you have questions about your medications.  LABS: -We will call you if your labs need attention.  NEXT APPOINTMENT: Return to clinic in 2 weeks with Pharmacy Clinic.  In general, to take care of your heart failure: -Limit your fluid intake to 2 Liters (half-gallon) per day.   -Limit your salt intake to ideally 2-3 grams (2000-3000 mg) per day. -Weigh yourself daily and record, and bring that "weight diary" to your next appointment.  (Weight gain of 2-3 pounds in 1 day typically means fluid weight.) -The medications for your heart are to help your heart and help you live longer.   -Please contact us before stopping any of your heart medications.  Call the clinic at [phone] with questions or to reschedule future appointments.

## 2019-07-05 NOTE — Progress Notes (Signed)
Pt dropped off forms from [employer] for his STD  Form completed and signed by Dr Haroldine Laws, faxed t o [employer] at [phone] along with most recent OV note.  Pt is aware, copy of form mailed to him

## 2019-07-08 ENCOUNTER — Encounter (INDEPENDENT_AMBULATORY_CARE_PROVIDER_SITE_OTHER): Payer: Commercial Managed Care - PPO | Admitting: Cardiology

## 2019-07-08 DIAGNOSIS — G4731 Primary central sleep apnea: Secondary | ICD-10-CM

## 2019-07-08 DIAGNOSIS — G4733 Obstructive sleep apnea (adult) (pediatric): Secondary | ICD-10-CM

## 2019-07-13 ENCOUNTER — Ambulatory Visit: Payer: Commercial Managed Care - PPO

## 2019-07-13 NOTE — Procedures (Signed)
° ° °  Sleep Study Report  Patient Information Name: Logan Kennedy  ID: [id number] Birth Date: Apr 21, 1966  Age: 53  Gender: Male BMI: 33.3 (W=251 lb, H=6' 1'') Study Date:07/08/2019 Referring Physician: Pierre Bali, MD   Summary & Diagnosis  TEST DESCRIPTION: Home sleep apnea testing was completed using the WatchPat, a Type 1 device, utilizing peripheral arterial tonometry (PAT), chest movement, actigraphy, pulse oximetry, pulse rate, body position and snore. AHI was calculated with apnea and hypopnea using valid sleep time as the denominator. RDI includes apneas, hypopneas, and RERAs. The data acquired and the scoring of sleep and all associated events were performed in accordance with the recommended standards and specifications as outlined in the AASM Manual for the Scoring of Sleep and Associated Events 2.2.0 (2015). FINDINGS: 1. Severe obstructive sleep apnea/hypopnea syndrome. The AHI was 76.4/hr. 2. Severe central sleep apnea was present. The pAHIc was 24.6/hr.  There was Cheyne Stokes respirations (17.3% of total sleep time). 3. Most respiratory events occurred in the non-supine position. 4. Significant oxygen desaturations were noted with the minimum O2 sat of 78%. The mean O2 saturation was 93%. 5. Nocturnal hypoxemia was present with 6.1 minutes spent with O2 saturations < 88%. 6. Significant snoring was noted throughout the study with 46% of time spent with severe snoring. 7. Normal sleep onset latency at 18 minutes and REM sleep onset latency of 73 minutes. The patient spent 27% of sleep time in REM sleep. 8. Fragmented sleep was present with 27 awakenings.  DIAGNOSIS: Severe Obstructive Sleep Apnea (G47.33)  RECOMMENDATIONS: 1. Clinical correlation of these findings is necessary. The decision to treat obstructive sleep apnea (OSA) is usually based on the presence of apnea symptoms or the presence of associated medical conditions such as Hypertension, Congestive Heart  Failure, Atrial Fibrillation or Obesity. The most common symptoms of OSA are snoring, gasping for breath while sleeping, daytime sleepiness and fatigue. 2. Initiating apnea therapy is recommended given the presence of symptoms and/or associated conditions. Recommend proceeding with one of the following:  a. Auto-CPAP therapy with a pressure range of 5-20cm H2O.  b. An oral appliance (OA) that can be obtained from certain dentists with expertise in sleep medicine. These are primarily of use in non-obese patients with mild and moderate disease.  c. An ENT consultation which may be useful to look for specific causes of obstruction and possible treatment options.  d. If patient is intolerant to PAP therapy, consider referral to ENT for evaluation for hypoglossal nerve stimulator. 3. Close follow-up is necessary to ensure success with CPAP or oral appliance therapy for maximum benefit . 4. A follow-up oximetry study on CPAP is recommended to assess the adequacy of therapy and determine the need for supplemental oxygen or the potential need for Bi-level therapy. An arterial blood gas to determine the adequacy of baseline ventilation and oxygenation should also be considered. 5. Healthy sleep recommendations include: adequate nightly sleep (normal 7-9 hrs/night), avoidance of caffeine afternoon and alcohol near bedtime, and maintaining a sleep environment that is cool, dark and quiet. 6. Weight loss for overweight patients is recommended. Even modest amounts of weight loss can significantly improve the severity of sleep apnea. 7. Snoring recommendations include: weight loss where appropriate, side sleeping, and avoidance of alcohol before bed. 8. Operation of motor vehicle or dangerous equipment must be avoided when feeling drowsy, excessively sleepy, ormentally fatigued.  Report prepared by: Signature: Fransico Him Electronically Signed: Jul 13, 2019

## 2019-07-14 ENCOUNTER — Telehealth: Payer: Self-pay | Admitting: *Deleted

## 2019-07-14 DIAGNOSIS — G4733 Obstructive sleep apnea (adult) (pediatric): Secondary | ICD-10-CM

## 2019-07-14 NOTE — Telephone Encounter (Signed)
-----   Message from Sueanne Margarita, MD sent at 07/13/2019  5:14 PM EST ----- Please let patient know that they have sleep apnea and recommend CPAP titration. Please set up titration in the sleep lab.

## 2019-07-14 NOTE — Progress Notes (Signed)
Referring Physician: Dr Domenic Polite Primary Care: Dr Gerarda Fraction Primary Cardiologist: Dr Domenic Polite  HF Cardiologist: Dr. 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   Admitted to [facility] 05/16/19 with increased dyspnea in the setting of CAP. Treated with antibiotics.  ECHO  showed reduced LVEF 20%. Cardiology was consulted. Diuresed with IV lasix 20 pounds. RHC/LHC on that admission showed minimal non-obstructive CAD with well-compensated hemodynamics (RAP 7, PA 45/19 [mPAP 32] PCWP 22, CI 2.6, PVR 1.8).  ECHO 05/17/19 EF LVEF 15-20% RV moderately reduced.  Recently presented to HF Clinic for follow-up with Dr. Haroldine Laws on 06/20/19. At that visit he reported feeling well. Weight was stable at 248-250 lbs. Denied SOB, orthopnea or PND. No edema. Had not heard from sleep apnea company so went back to using his old device. No dizziness.   Today he returns to HF clinic for pharmacist medication titration. At recent visits to HF Clinic, Delene Loll was initiated and increased to 49/51 mg BID. Overall he is doing well today. Does note some fatigue, but could be related to him doing outside yard work. No dizziness, lightheadedness, chest pain or palpitations. His breathing is good, no SOB/DOE. Walks his dog 5-6 times per day to stay active. His weight has been stable at home. He has not needed any PRN furosemide. No LEE, PND or orthopnea. His appetite is fine and he follows a low sodium diet.   . Shortness of breath/dyspnea on exertion? no  . Orthopnea/PND? no . Edema? no . Lightheadedness/dizziness? no . Daily weights at home? yes . Blood pressure/heart rate monitoring at home? No . Following low-sodium/fluid-restricted diet? Yes  HF Medications: Carvedilol 3.125 mg BID Entresto 49/51 mg BID Spironolactone 12.5 mg daily Digoxin 0.125 mg daily Furosemide 40 mg daily PRN  Has the patient  been experiencing any side effects to the medications prescribed?  no  Does the patient have any problems obtaining medications due to transportation or finances?   No - has Pharmacist, community  Understanding of regimen: good Understanding of indications: good Potential of compliance: excellent - his wife is a Marine scientist and helps him remember his medications.  Patient understands to avoid NSAIDs. Patient understands to avoid decongestants.    Pertinent Lab Values: . Serum creatinine 1.22, BUN 15, Potassium 4.5, Sodium 139  Vital Signs: . Weight: 251.4 lbs (last clinic weight: 254 lbs) . Blood pressure: 126/86  . Heart rate: 91   Assessment: 1. Chronic Systolic HF - ECHO [id number] EF 15% with RV dysfunction. ? PVC or HTN - Cath 10/20 minimal CAD. Well compensated hemodynamics  - NYHA class 2 symptoms, euvolemic on exam.  - Vitals: BP 120/84, HR 98 - Labs: Scr increased from 0.75 to 1.22 but BUN stable at 15. K 4.5. Labs discussed with Dr. Haroldine Laws. Repeat BMET next week.   - Continue furosemide 40 mg daily PRN. Has not needed. - Continue carvedilol 3.125 mg BID. Will not increase today due to some complaints of fatigue. Consider increasing at next visit.  - Continue Entresto 49/51 mg BID.  - Increase spironolactone to 25 mg daily. Repeat BMET in 1 week. - Continue digoxin 0.125 mg daily. Digoxin level 0.5 ng/mL on 06/20/19.  - Would be good candidate for SLGT2i in the future (Farxiga copay $0.00).  - Completed home sleep study and will need to undergo CPAP titration. He is working to schedule this currently.  2. CAD - minimal by cath 10/20 - add statin eventually  3. OSA - currently using old CPAP machine. Plan for new test tomorrow.   4. Smoker  - has quit.   5. PVCs   - Possible cause of CM - Zio patch results from 07/07/19: One run of SVT lasting 6 beats with a max rate of 167 bpm (avg 149 bpm). Occasional multifocal PVCs (4.8% burden). No high grade arrhythmias.    6. Possible T2DM -Hemoglobin A1C 6.7% today. Will plan to recheck at next visit to confirm diagnosis. If A1C >6.5% on repeat lab test, will need to discuss glucose-lowering therapies as well as statin.   Plan: 1) Medication changes: Based on clinical presentation, vital signs and recent labs will increase spironolactone to 25 mg daily. Repeat BMET in 1 week. 2) Labs: Scr 1.22, K 4.5 3) Follow-up: BMET in 1 week. Pharmacy Clinic in 2 weeks.    Logan Kennedy, PharmD, BCPS, BCCP, CPP Heart Failure Clinic Pharmacist [phone]

## 2019-07-14 NOTE — Telephone Encounter (Signed)
Informed patient of sleep study results and patient understanding was verbalized. Patient understands his sleep study showed they have sleep apnea and recommend CPAP titration. Please set up titration in the sleep lab.   Pt is aware and agreeable to his results. titration sent to precert

## 2019-07-19 ENCOUNTER — Other Ambulatory Visit: Payer: Self-pay

## 2019-07-19 ENCOUNTER — Ambulatory Visit (HOSPITAL_COMMUNITY)
Admission: RE | Admit: 2019-07-19 | Discharge: 2019-07-19 | Disposition: A | Discharge status: Home / Self Care | Payer: Commercial Managed Care - PPO | Source: Ambulatory Visit | Attending: Cardiology | Admitting: Cardiology

## 2019-07-19 VITALS — BP 120/84 | HR 96 | Wt 251.4 lb

## 2019-07-19 DIAGNOSIS — Z87891 Personal history of nicotine dependence: Secondary | ICD-10-CM | POA: Insufficient documentation

## 2019-07-19 DIAGNOSIS — I2510 Atherosclerotic heart disease of native coronary artery without angina pectoris: Secondary | ICD-10-CM | POA: Insufficient documentation

## 2019-07-19 DIAGNOSIS — Z79899 Other long term (current) drug therapy: Secondary | ICD-10-CM | POA: Diagnosis not present

## 2019-07-19 DIAGNOSIS — G4733 Obstructive sleep apnea (adult) (pediatric): Secondary | ICD-10-CM | POA: Insufficient documentation

## 2019-07-19 DIAGNOSIS — I493 Ventricular premature depolarization: Secondary | ICD-10-CM | POA: Insufficient documentation

## 2019-07-19 DIAGNOSIS — I5022 Chronic systolic (congestive) heart failure: Secondary | ICD-10-CM | POA: Diagnosis not present

## 2019-07-19 LAB — BASIC METABOLIC PANEL
Anion gap: 7 (ref 5–15)
BUN: 15 mg/dL (ref 6–20)
CO2: 26 mmol/L (ref 22–32)
Calcium: 9.4 mg/dL (ref 8.9–10.3)
Chloride: 106 mmol/L (ref 98–111)
Creatinine, Ser: 1.22 mg/dL (ref 0.61–1.24)
GFR calc Af Amer: >60 mL/min (ref >60)
GFR calc non Af Amer: >60 mL/min (ref >60)
Glucose, Bld: 93 mg/dL (ref 70–99)
Potassium: 4.5 mmol/L (ref 3.5–5.1)
Sodium: 139 mmol/L (ref 135–145)

## 2019-07-19 LAB — HEMOGLOBIN A1C
Hgb A1c MFr Bld: 6.7 % — ABNORMAL HIGH (ref 4.8–5.6)
Mean Plasma Glucose: 145.59 mg/dL

## 2019-07-19 LAB — BRAIN NATRIURETIC PEPTIDE: B Natriuretic Peptide: 474.1 pg/mL — ABNORMAL HIGH (ref 0.0–100.0)

## 2019-07-19 MED ORDER — SPIRONOLACTONE 25 MG PO TABS: 25.0000 mg | ORAL_TABLET | Freq: Every day | ORAL | 3 refills | Status: DC

## 2019-07-19 MED ORDER — DIGOXIN 125 MCG PO TABS: 0.1250 mg | ORAL_TABLET | Freq: Every day | ORAL | 3 refills | Status: DC

## 2019-07-19 NOTE — Patient Instructions (Signed)
It was a pleasure seeing you today!  MEDICATIONS: -We are changing your medications today -Increase spironolactone to 25 mg (1 tab) daily -Call if you have questions about your medications.  LABS: -We will call you if your labs need attention.  NEXT APPOINTMENT: Return to clinic in 1 week for labs and 2 weeks for Pharmacy Clinic.  In general, to take care of your heart failure: -Limit your fluid intake to 2 Liters (half-gallon) per day.   -Limit your salt intake to ideally 2-3 grams (2000-3000 mg) per day. -Weigh yourself daily and record, and bring that "weight diary" to your next appointment.  (Weight gain of 2-3 pounds in 1 day typically means fluid weight.) -The medications for your heart are to help your heart and help you live longer.   -Please contact us before stopping any of your heart medications.  Call the clinic at [phone] with questions or to reschedule future appointments.

## 2019-07-19 NOTE — Addendum Note (Signed)
Addended by: Freada Bergeron on: 07/19/2019 12:08 PM   Modules accepted: Orders

## 2019-07-25 ENCOUNTER — Other Ambulatory Visit: Payer: Self-pay

## 2019-07-25 ENCOUNTER — Ambulatory Visit (HOSPITAL_COMMUNITY)
Admission: RE | Admit: 2019-07-25 | Discharge: 2019-07-25 | Disposition: A | Discharge status: Home / Self Care | Payer: Commercial Managed Care - PPO | Source: Ambulatory Visit | Attending: Internal Medicine | Admitting: Internal Medicine

## 2019-07-25 ENCOUNTER — Other Ambulatory Visit (HOSPITAL_COMMUNITY): Payer: Self-pay | Admitting: *Deleted

## 2019-07-25 DIAGNOSIS — I5022 Chronic systolic (congestive) heart failure: Secondary | ICD-10-CM | POA: Insufficient documentation

## 2019-07-25 LAB — BASIC METABOLIC PANEL
Anion gap: 7 (ref 5–15)
BUN: 13 mg/dL (ref 6–20)
CO2: 29 mmol/L (ref 22–32)
Calcium: 9.5 mg/dL (ref 8.9–10.3)
Chloride: 102 mmol/L (ref 98–111)
Creatinine, Ser: 0.84 mg/dL (ref 0.61–1.24)
GFR calc Af Amer: >60 mL/min (ref >60)
GFR calc non Af Amer: >60 mL/min (ref >60)
Glucose, Bld: 77 mg/dL (ref 70–99)
Potassium: 4.5 mmol/L (ref 3.5–5.1)
Sodium: 138 mmol/L (ref 135–145)

## 2019-07-26 ENCOUNTER — Telehealth: Payer: Self-pay | Admitting: *Deleted

## 2019-07-26 NOTE — Telephone Encounter (Signed)
Staff message sen to [place] per [employer] no PA is required. Call reference # [id number].

## 2019-08-02 ENCOUNTER — Inpatient Hospital Stay (HOSPITAL_COMMUNITY)
Admission: RE | Admit: 2019-08-02 | Discharge: 2019-08-02 | Disposition: A | Discharge status: Home / Self Care | Payer: Commercial Managed Care - PPO | Source: Ambulatory Visit

## 2019-08-02 ENCOUNTER — Other Ambulatory Visit (HOSPITAL_BASED_OUTPATIENT_CLINIC_OR_DEPARTMENT_OTHER): Payer: Self-pay

## 2019-08-04 ENCOUNTER — Telehealth (HOSPITAL_COMMUNITY): Payer: Self-pay

## 2019-08-04 ENCOUNTER — Telehealth: Payer: Self-pay | Admitting: *Deleted

## 2019-08-04 NOTE — Telephone Encounter (Signed)
Patient is scheduled for CPAP Titration on 08/21/19. Pt  is scheduled for COVID screening on 08/18/19 11AM prior to titration.   Patient understands his titration study will be done at [facility] sleep lab. Patient understands he will receive a letter in a week or so detailing appointment, date, time, and location. Patient understands to call if he does not receive the letter  in a timely manner. Patient agrees with treatment and thanked me for call.

## 2019-08-04 NOTE — Telephone Encounter (Signed)
-----   Message from Lauralee Evener, [place] sent at 07/26/2019  3:20 PM EST ----- Regarding: RE: precert Per [employer] rep no PA is required. Ok to schedule CPAP titration study. Call reference# [id number]. ----- Message ----- From: Freada Bergeron, CMA Sent: 07/14/2019   5:51 PM EST To: [facility] Sleep Studies Subject: precert                                         recommend CPAP titration

## 2019-08-04 NOTE — Telephone Encounter (Signed)
LMOM for Patient to return call because [facility] said that they received an order to help the Patient with PAP therapy, multiple attempts with no response.

## 2019-08-08 ENCOUNTER — Telehealth (HOSPITAL_COMMUNITY): Payer: Self-pay

## 2019-08-08 NOTE — Telephone Encounter (Signed)
Patient is scheduled for a CPAP Titration on 08/21/2019, per Dr. Radford Pax.

## 2019-08-08 NOTE — Telephone Encounter (Signed)
Patient has not contacted the office about his [place] Sleep Study. Voided order.

## 2019-08-15 ENCOUNTER — Ambulatory Visit (HOSPITAL_COMMUNITY): Admission: RE | Admit: 2019-08-15 | Payer: Commercial Managed Care - PPO | Source: Ambulatory Visit

## 2019-08-15 ENCOUNTER — Encounter (HOSPITAL_COMMUNITY): Payer: Commercial Managed Care - PPO | Admitting: Internal Medicine

## 2019-08-16 ENCOUNTER — Telehealth (HOSPITAL_COMMUNITY): Payer: Self-pay

## 2019-08-16 NOTE — Telephone Encounter (Signed)

## 2019-08-17 ENCOUNTER — Telehealth (HOSPITAL_COMMUNITY): Payer: Self-pay

## 2019-08-17 ENCOUNTER — Ambulatory Visit (HOSPITAL_BASED_OUTPATIENT_CLINIC_OR_DEPARTMENT_OTHER)
Admission: RE | Admit: 2019-08-17 | Discharge: 2019-08-17 | Disposition: A | Discharge status: Home / Self Care | Payer: Commercial Managed Care - PPO | Source: Ambulatory Visit | Attending: Internal Medicine | Admitting: Internal Medicine

## 2019-08-17 ENCOUNTER — Ambulatory Visit (HOSPITAL_COMMUNITY)
Admission: RE | Admit: 2019-08-17 | Discharge: 2019-08-17 | Disposition: A | Discharge status: Home / Self Care | Payer: Commercial Managed Care - PPO | Source: Ambulatory Visit | Attending: Internal Medicine | Admitting: Internal Medicine

## 2019-08-17 ENCOUNTER — Other Ambulatory Visit: Payer: Self-pay

## 2019-08-17 ENCOUNTER — Encounter (HOSPITAL_COMMUNITY): Payer: Self-pay | Admitting: Internal Medicine

## 2019-08-17 VITALS — BP 128/63 | HR 73 | Wt 251.8 lb

## 2019-08-17 DIAGNOSIS — I493 Ventricular premature depolarization: Secondary | ICD-10-CM

## 2019-08-17 DIAGNOSIS — G4733 Obstructive sleep apnea (adult) (pediatric): Secondary | ICD-10-CM | POA: Diagnosis not present

## 2019-08-17 DIAGNOSIS — F17210 Nicotine dependence, cigarettes, uncomplicated: Secondary | ICD-10-CM | POA: Insufficient documentation

## 2019-08-17 DIAGNOSIS — I5022 Chronic systolic (congestive) heart failure: Secondary | ICD-10-CM

## 2019-08-17 DIAGNOSIS — Z8249 Family history of ischemic heart disease and other diseases of the circulatory system: Secondary | ICD-10-CM | POA: Diagnosis not present

## 2019-08-17 DIAGNOSIS — I2510 Atherosclerotic heart disease of native coronary artery without angina pectoris: Secondary | ICD-10-CM | POA: Insufficient documentation

## 2019-08-17 DIAGNOSIS — I471 Supraventricular tachycardia: Secondary | ICD-10-CM | POA: Insufficient documentation

## 2019-08-17 DIAGNOSIS — I313 Pericardial effusion (noninflammatory): Secondary | ICD-10-CM | POA: Diagnosis not present

## 2019-08-17 DIAGNOSIS — Z79899 Other long term (current) drug therapy: Secondary | ICD-10-CM | POA: Diagnosis not present

## 2019-08-17 DIAGNOSIS — I428 Other cardiomyopathies: Secondary | ICD-10-CM | POA: Insufficient documentation

## 2019-08-17 DIAGNOSIS — I110 Hypertensive heart disease with heart failure: Secondary | ICD-10-CM | POA: Diagnosis not present

## 2019-08-17 LAB — BASIC METABOLIC PANEL
Anion gap: 5 (ref 5–15)
BUN: 14 mg/dL (ref 6–20)
CO2: 29 mmol/L (ref 22–32)
Calcium: 9.5 mg/dL (ref 8.9–10.3)
Chloride: 106 mmol/L (ref 98–111)
Creatinine, Ser: 0.74 mg/dL (ref 0.61–1.24)
GFR calc Af Amer: >60 mL/min (ref >60)
GFR calc non Af Amer: >60 mL/min (ref >60)
Glucose, Bld: 83 mg/dL (ref 70–99)
Potassium: 5.4 mmol/L — ABNORMAL HIGH (ref 3.5–5.1)
Sodium: 140 mmol/L (ref 135–145)

## 2019-08-17 LAB — DIGOXIN LEVEL: Digoxin Level: 0.4 ng/mL — ABNORMAL LOW (ref 0.8–2.0)

## 2019-08-17 LAB — BRAIN NATRIURETIC PEPTIDE: B Natriuretic Peptide: 65.7 pg/mL (ref 0.0–100.0)

## 2019-08-17 MED ORDER — SACUBITRIL-VALSARTAN 97-103 MG PO TABS: 1.0000 | ORAL_TABLET | Freq: Two times a day (BID) | ORAL | 6 refills | Status: DC

## 2019-08-17 MED ORDER — CARVEDILOL 3.125 MG PO TABS: 3.1250 mg | ORAL_TABLET | Freq: Two times a day (BID) | ORAL | 5 refills | Status: DC

## 2019-08-17 MED ORDER — CARVEDILOL 6.25 MG PO TABS: 6.2500 mg | ORAL_TABLET | Freq: Two times a day (BID) | ORAL | 5 refills | Status: DC

## 2019-08-17 NOTE — Telephone Encounter (Signed)
-----   Message from Jolaine Artist, MD sent at 08/17/2019 12:19 PM EST ----- Potassium up. Recheck 1 week.

## 2019-08-17 NOTE — Progress Notes (Signed)
  Echocardiogram 2D Echocardiogram has been performed.  Logan Kennedy 08/17/2019, 9:33 AM

## 2019-08-17 NOTE — Telephone Encounter (Signed)
Spoke with patient, aware of lab results. Advised need to repeat bmet in 1 week due to elevated K. Pt also advised to monitor foods higher in K. Appt made. Verbalized understanding.

## 2019-08-17 NOTE — Progress Notes (Signed)
ADVANCED HF CLINIC NOTE  Referring Physician: Dr Domenic Polite Primary Care: Dr Gerarda Fraction Primary Cardiologist: Dr Domenic Polite   HPI: [name] has been referred to the HF clinic by Dr Domenic Polite for HF consultation.   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   Admitted to [facility] 05/16/19 with increased dyspnea in the setting of CAP. Treated with antibiotics.  ECHO  showed reduced LVEF 20%. Cardiology was consulted. Diuresed with IV lasix 20 pounds.   Underwent R/L cath 05/30/19   Prox RCA lesion is 20% stenosed.  Ost LAD to Prox LAD lesion is 20% stenosed.  Mid LAD lesion is 20% stenosed.   Findings:  RA = 7 RV = 48/10 PA = 45/19 (32) PCW = 22 Fick cardiac output/index = 6.2/2.6 PVR = 1.8 WU FA sat = 86% PA sat = 57%, 62%  Assessment:  1. Minimal non-obstructive CAD 2. Severe NICM EF 20% (suspect PVC or HTN mediated) 3. Relatively well-compensated hemodynamics     Returns for routine f/u. Says he is feeling good. Weight stable 248-250. Denies SOB, orthopnea or PND. No edema. HAs not heard from sleep apnea company so went back to using his old device. No dizziness.   Echo today. EF 25% LV still dilated. RV normal.   Feeling better. Able to do ADLs without much difficulty. But gets SOB if goes too fast. No dizziness, no presyncope. No edema, orthopnea or PND. Pending in lab CPAP titration. Not on lasix.   Cardiac Testing  ECHO 05/17/19 EF LVEF 15-20% RV moderately reduced.  Zio 07/07/19 1. Sinus rhythm -  avg HR of 96 2. One run of SVT lasting 6 beats with a max rate of 167 bpm (avg 149 bpm). 3. Occasional multifocal PVCs (4.8% burden) 4. Two patient diary events both associated with PVCs 5. No high-grade arrhythmias    Past Medical History:  Diagnosis Date  . Tobacco use     Current Outpatient Medications  Medication Sig Dispense Refill  . carvedilol  (COREG) 3.125 MG tablet Take 1 tablet (3.125 mg total) by mouth 2 (two) times daily with a meal. 60 tablet 2  . digoxin (LANOXIN) 0.125 MG tablet Take 1 tablet (0.125 mg total) by mouth daily. 90 tablet 3  . furosemide (LASIX) 40 MG tablet Take 1 tablet (40 mg total) by mouth daily as needed (for weight 252 lb or greater). 30 tablet 2  . sacubitril-valsartan (ENTRESTO) 49-51 MG Take 1 tablet by mouth 2 (two) times daily. 60 tablet 6  . spironolactone (ALDACTONE) 25 MG tablet Take 1 tablet (25 mg total) by mouth daily. 90 tablet 3   No current facility-administered medications for this encounter.    No Known Allergies    Social History   Socioeconomic History  . Marital status: Married    Spouse name: Not on file  . Number of children: Not on file  . Years of education: Not on file  . Highest education level: Not on file  Occupational History  . Not on file  Tobacco Use  . Smoking status: Current Every Day Smoker    Packs/day: 1.50    Years: 33.00    Pack years: 49.50    Types: Cigarettes  . Smokeless tobacco: Never Used  Substance and Sexual Activity  . Alcohol use: No  . Drug use: No  . Sexual activity: Not on file  Other Topics Concern  .  Not on file  Social History Narrative  . Not on file   Social Determinants of Health   Financial Resource Strain:   . Difficulty of Paying Living Expenses: Not on file  Food Insecurity:   . Worried About Charity fundraiser in the Last Year: Not on file  . Ran Out of Food in the Last Year: Not on file  Transportation Needs:   . Lack of Transportation (Medical): Not on file  . Lack of Transportation (Non-Medical): Not on file  Physical Activity:   . Days of Exercise per Week: Not on file  . Minutes of Exercise per Session: Not on file  Stress:   . Feeling of Stress : Not on file  Social Connections:   . Frequency of Communication with Friends and Family: Not on file  . Frequency of Social Gatherings with Friends and Family:  Not on file  . Attends Religious Services: Not on file  . Active Member of Clubs or Organizations: Not on file  . Attends Archivist Meetings: Not on file  . Marital Status: Not on file  Intimate Partner Violence:   . Fear of Current or Ex-Partner: Not on file  . Emotionally Abused: Not on file  . Physically Abused: Not on file  . Sexually Abused: Not on file      Family History  Problem Relation Age of Onset  . Heart failure Mother   . COPD Father     Vitals:   08/17/19 0946  BP: 128/63  Pulse: 73  SpO2: 97%  Weight: 114.2 kg (251 lb 12.8 oz)   Wt Readings from Last 3 Encounters:  08/17/19 114.2 kg (251 lb 12.8 oz)  07/19/19 114 kg (251 lb 6.4 oz)  07/05/19 115.2 kg (254 lb)    PHYSICAL EXAM: General:  Well appearing. No resp difficulty HEENT: normal Neck: supple. no JVD. Carotids 2+ bilat; no bruits. No lymphadenopathy or thryomegaly appreciated. Cor: PMI nondisplaced. Regular mildly tachy occasional ectopy No rubs, gallops or murmurs. Lungs: clear Abdomen: soft, nontender, nondistended. No hepatosplenomegaly. No bruits or masses. Good bowel sounds. Extremities: no cyanosis, clubbing, rash, edema Neuro: alert & orientedx3, cranial nerves grossly intact. moves all 4 extremities w/o difficulty. Affect pleasant   ECG: Sinus Tach 100 QRS 104 ms Inferolateral TWI Personally reviewed   ASSESSMENT & PLAN:  1. Chronic Systolic HF - ECHO [id number] EF 15% with RV dysfunction. ? PVC or HTN - Cath 10/20 minimal CAD. Well compensated hemodynamics  - Doing much better. NYHA II-early III - Tachycardia improving - Echo today EF 25% RV ok  - Increase carvedilol to 6.25 bid  - Continue digoxin 0.125 mg daily. - Continue 12.5 mg spironolactone daily.  - Increase Entresto 97/103 bid - Consider SGLT2i at next visit - F/u on OSA - Have decided to give GDMT more time to work and not proceed with ICD at this point.  - Labs today. Return in 3 months.  2. CAD -  minimal by cath 10/20  3. OSA - currently using old CPAP machine.  - has in-lb titration coming up   4. Smoker  - has quit.   5. PVCs  - Possible cause of CM - Zio patch 12/20  4.8% PVCs -> should improve with CPAP  Glori Bickers MD 10:19 AM

## 2019-08-17 NOTE — Patient Instructions (Addendum)
INCREASE Entresto to 97/103mg  (1 tab) twice a day  INCREASE Coreg to 6.25mg  (1 tab) twice a day  Labs today We will only contact you if something comes back abnormal or we need to make some changes. Otherwise no news is good news!  Your physician recommends that you schedule a follow-up appointment in: 3 months with Dr Haroldine Laws on Monday April 19th, 2021 at 10:20am. [place]  Please call office at [phone] option 2 if you have any questions or concerns.   At the [facility], you and your health needs are our priority. As part of our continuing mission to provide you with exceptional heart care, we have created designated Provider Care Teams. These Care Teams include your primary Cardiologist (physician) and Advanced Practice Providers (APPs- Physician Assistants and Nurse Practitioners) who all work together to provide you with the care you need, when you need it.   You may see any of the following providers on your designated Care Team at your next follow up: Marland Kitchen Dr Glori Bickers . Dr Loralie Champagne . Darrick Grinder, NP . Lyda Jester, PA . Audry Riles, PharmD   Please be sure to bring in all your medications bottles to every appointment.

## 2019-08-17 NOTE — Addendum Note (Signed)
Encounter addended by: Valeda Malm, RN on: 08/17/2019 12:22 PM  Actions taken: Pharmacy for encounter modified

## 2019-08-18 ENCOUNTER — Other Ambulatory Visit (HOSPITAL_COMMUNITY)
Admission: RE | Admit: 2019-08-18 | Discharge: 2019-08-18 | Disposition: A | Discharge status: Home / Self Care | Payer: Commercial Managed Care - PPO | Source: Ambulatory Visit | Attending: Cardiology | Admitting: Cardiology

## 2019-08-18 DIAGNOSIS — Z20822 Contact with and (suspected) exposure to covid-19: Secondary | ICD-10-CM | POA: Insufficient documentation

## 2019-08-18 DIAGNOSIS — Z01812 Encounter for preprocedural laboratory examination: Secondary | ICD-10-CM | POA: Insufficient documentation

## 2019-08-18 LAB — SARS CORONAVIRUS 2 (TAT 6-24 HRS): SARS Coronavirus 2: NEGATIVE

## 2019-08-19 ENCOUNTER — Telehealth (HOSPITAL_COMMUNITY): Payer: Self-pay

## 2019-08-19 NOTE — Telephone Encounter (Signed)
Received disability paper work for patient. Given to [place] to complete.

## 2019-08-21 ENCOUNTER — Ambulatory Visit: Payer: Commercial Managed Care - PPO | Attending: Cardiology | Admitting: Cardiology

## 2019-08-21 ENCOUNTER — Other Ambulatory Visit: Payer: Self-pay

## 2019-08-21 DIAGNOSIS — I471 Supraventricular tachycardia: Secondary | ICD-10-CM | POA: Diagnosis not present

## 2019-08-21 DIAGNOSIS — G4733 Obstructive sleep apnea (adult) (pediatric): Secondary | ICD-10-CM | POA: Diagnosis present

## 2019-08-21 DIAGNOSIS — I493 Ventricular premature depolarization: Secondary | ICD-10-CM | POA: Diagnosis not present

## 2019-08-22 NOTE — Telephone Encounter (Signed)
Finished disability paper faxed to [employer] at [phone]. original to be scanned in.

## 2019-08-23 ENCOUNTER — Other Ambulatory Visit: Payer: Self-pay

## 2019-08-23 ENCOUNTER — Ambulatory Visit (HOSPITAL_COMMUNITY)
Admission: RE | Admit: 2019-08-23 | Discharge: 2019-08-23 | Disposition: A | Discharge status: Home / Self Care | Payer: Commercial Managed Care - PPO | Source: Ambulatory Visit | Attending: Cardiology | Admitting: Cardiology

## 2019-08-23 ENCOUNTER — Telehealth: Payer: Self-pay | Admitting: *Deleted

## 2019-08-23 DIAGNOSIS — I5022 Chronic systolic (congestive) heart failure: Secondary | ICD-10-CM | POA: Diagnosis present

## 2019-08-23 LAB — BASIC METABOLIC PANEL
Anion gap: 8 (ref 5–15)
BUN: 19 mg/dL (ref 6–20)
CO2: 23 mmol/L (ref 22–32)
Calcium: 9.1 mg/dL (ref 8.9–10.3)
Chloride: 107 mmol/L (ref 98–111)
Creatinine, Ser: 0.73 mg/dL (ref 0.61–1.24)
GFR calc Af Amer: >60 mL/min (ref >60)
GFR calc non Af Amer: >60 mL/min (ref >60)
Glucose, Bld: 132 mg/dL — ABNORMAL HIGH (ref 70–99)
Potassium: 4.6 mmol/L (ref 3.5–5.1)
Sodium: 138 mmol/L (ref 135–145)

## 2019-08-23 NOTE — Procedures (Signed)
Patient Name: Logan Kennedy, Logan Kennedy Date: 08/21/2019 Gender: Male D.O.B: Jul 15, 1966 Age (years): 59 Referring Provider: Fransico Him MD, ABSM Height (inches): 73 Interpreting Physician: Fransico Him MD, ABSM Weight (lbs): 250 RPSGT: Rosebud Poles BMI: 33 MRN: [id number] Neck Size: 18.50  CLINICAL INFORMATION The patient is referred for a CPAP titration to treat sleep apnea.  SLEEP STUDY TECHNIQUE As per the AASM Manual for the Scoring of Sleep and Associated Events v2.3 (April 2016) with a hypopnea requiring 4% desaturations.  The channels recorded and monitored were frontal, central and occipital EEG, electrooculogram (EOG), submentalis EMG (chin), nasal and oral airflow, thoracic and abdominal wall motion, anterior tibialis EMG, snore microphone, electrocardiogram, and pulse oximetry. Continuous positive airway pressure (CPAP) was initiated at the beginning of the study and titrated to treat sleep-disordered breathing.  MEDICATIONS Medications self-administered by patient taken the night of the study : N/A  TECHNICIAN COMMENTS Comments added by technician: CPAP therapy started at 5 cm of H20 instead of 4 cm of H20, due to patient asking for more pressure. Patient titrated to 10 cm of H20 due to snoring and low SAO2. Patient very restless during first part of study, after bathroom trip patient became very relaxed and maintiained sleep for the remainder of study. Patient eventually tolerated CPAP very well. Patient states that he never sleeps in supine position. See print outs of EKG pattern, pages 83 and 375. Significant coughing episodes noticed throughout study. Optimal pressure obtained only if patient truly does not sleep in supine position, due to supine REM sleep was not observed. Heated humidification added to CPAP device Comments added by scorer: N/A  RESPIRATORY PARAMETERS Optimal PAP Pressure (cm): 10  AHI at Optimal Pressure (/hr):0.4 Overall Minimal O2 (%):88.0  Supine  % at Optimal Pressure (%): 0 Minimal O2 at Optimal Pressure (%): 88.0   SLEEP ARCHITECTURE The study was initiated at 11:05:51 PM and ended at 5:34:27 AM.  Sleep onset time was 23.3 minutes and the sleep efficiency was 61.6%. The total sleep time was 239.3 minutes.  The patient spent 9.4% of the night in stage N1 sleep, 47.8% in stage N2 sleep, 23.3% in stage N3 and 19.4% in REM.Stage REM latency was 192.5 minutes  Wake after sleep onset was 126.0. Alpha intrusion was absent. Supine sleep was 0.00%.  CARDIAC DATA The 2 lead EKG demonstrated sinus rhythm. The mean heart rate was 57.9 beats per minute. Other EKG findings include:PVCs and nonsustained atrial tachycardia  LEG MOVEMENT DATA The total Periodic Limb Movements of Sleep (PLMS) were 0. The PLMS index was 0.0. A PLMS index of <15 is considered normal in adults.  IMPRESSIONS - The optimal PAP pressure was 10 cm of water. - Central sleep apnea was not noted during this titration (CAI = 0.0/h). - Mild oxygen desaturations were observed during this titration (min O2 = 88.0%). - The patient snored with moderate snoring volume during this titration study. - 2-lead EKG demonstrated: PVCs and nonsustained atrial tachycardia - Clinically significant periodic limb movements were not noted during this study. Arousals associated with PLMs were significant.  DIAGNOSIS - Obstructive Sleep Apnea (327.23 [G47.33 ICD-10]) - PVCs - Nonsustained atrial tachycardia  RECOMMENDATIONS - Trial of CPAP therapy on 10 cm H2O with a Large size Resmed Full Face Mask AirFit F20 mask and heated humidification. - Avoid alcohol, sedatives and other CNS depressants that may worsen sleep apnea and disrupt normal sleep architecture. - Sleep hygiene should be reviewed to assess factors that may improve sleep quality. -  Weight management and regular exercise should be initiated or continued. - Return to Sleep Center for re-evaluation after 10 weeks of  therapy  [Electronically signed] 08/23/2019 10:03 AM  Fransico Him MD, ABSM Diplomate, American Board of Sleep Medicine

## 2019-08-23 NOTE — Telephone Encounter (Signed)
-----   Message from Sueanne Margarita, MD sent at 08/23/2019 10:05 AM EST ----- Please let patient know that they had a successful PAP titration and let DME know that orders are in EPIC.  Please set up 10 week OV with me.

## 2019-08-23 NOTE — Telephone Encounter (Signed)
Informed patient of sleep study results and patient understanding was verbalized. Patient understands his sleep study showed they had a successful PAP titration and let DME know that orders are in EPIC. Please set up 10 week OV with me.   Upon patient request DME selection is [facility]. Patient understands he will be contacted by [facility] to set up his cpap. Patient understands to call if [facility] does not contact him with new setup in a timely manner. Patient understands they will be called once confirmation has been received from [facility] that they have received their new machine to schedule 10 week follow up appointment.  [facility] notified of new cpap order  Please add to airview Patient was grateful for the call and thanked me.    

## 2019-09-07 NOTE — Addendum Note (Signed)
Encounter addended by: Orma Render, RPH-CPP on: 09/07/2019 10:08 AM  Actions taken: Delete clinical note

## 2019-09-14 NOTE — Telephone Encounter (Signed)
Patient is using his old machine for now because financially he can't afford a new cpap machine at this time. I will notify the dme.

## 2019-09-26 ENCOUNTER — Telehealth (HOSPITAL_COMMUNITY): Payer: Self-pay | Admitting: Pharmacist

## 2019-09-26 NOTE — Telephone Encounter (Signed)
Received message from patient that he is out of work and has lost his prescription insurance. He can no longer afford Entresto.  Will start patient assistance application for Entresto. Left patient portion of the application at the front desk for patient to sign. Once signed, will fax into [employer].  Provided patient with Entresto samples.  Medication: Entresto Quantity: 1 bottle  Lot: [id number] Expiration date: 01/2021  Dropped samples off at front desk for patient.   Audry Riles, PharmD, BCPS, CPP Heart Failure Clinic Pharmacist [phone]

## 2019-09-27 NOTE — Telephone Encounter (Signed)
Sent in Manufacturer's Assistance application to [employer] for Entresto.    Application pending, will continue to follow.  Vienna Folden, PharmD, BCPS, BCCP, CPP Heart Failure Clinic Pharmacist [phone]  

## 2019-10-05 NOTE — Telephone Encounter (Signed)
Advanced Heart Failure Patient Advocate Encounter   Patient was approved to receive Entresto from [employer]. Patient aware.   Patient ID: [id number] Effective dates: 10/05/19 through 10/04/20  Audry Riles, PharmD, BCPS, BCCP, CPP Heart Failure Clinic Pharmacist [phone]

## 2019-11-21 ENCOUNTER — Ambulatory Visit (HOSPITAL_COMMUNITY)
Admission: RE | Admit: 2019-11-21 | Discharge: 2019-11-21 | Disposition: A | Discharge status: Home / Self Care | Payer: Self-pay | Source: Ambulatory Visit | Attending: Internal Medicine | Admitting: Internal Medicine

## 2019-11-21 ENCOUNTER — Encounter (HOSPITAL_COMMUNITY): Payer: Self-pay | Admitting: Internal Medicine

## 2019-11-21 ENCOUNTER — Other Ambulatory Visit: Payer: Self-pay

## 2019-11-21 VITALS — BP 130/76 | HR 90 | Wt 254.8 lb

## 2019-11-21 DIAGNOSIS — G4733 Obstructive sleep apnea (adult) (pediatric): Secondary | ICD-10-CM

## 2019-11-21 DIAGNOSIS — F17210 Nicotine dependence, cigarettes, uncomplicated: Secondary | ICD-10-CM | POA: Insufficient documentation

## 2019-11-21 DIAGNOSIS — Z79899 Other long term (current) drug therapy: Secondary | ICD-10-CM | POA: Insufficient documentation

## 2019-11-21 DIAGNOSIS — I493 Ventricular premature depolarization: Secondary | ICD-10-CM

## 2019-11-21 DIAGNOSIS — I5022 Chronic systolic (congestive) heart failure: Secondary | ICD-10-CM

## 2019-11-21 DIAGNOSIS — I2510 Atherosclerotic heart disease of native coronary artery without angina pectoris: Secondary | ICD-10-CM | POA: Insufficient documentation

## 2019-11-21 DIAGNOSIS — I110 Hypertensive heart disease with heart failure: Secondary | ICD-10-CM | POA: Insufficient documentation

## 2019-11-21 DIAGNOSIS — I428 Other cardiomyopathies: Secondary | ICD-10-CM | POA: Insufficient documentation

## 2019-11-21 DIAGNOSIS — Z8249 Family history of ischemic heart disease and other diseases of the circulatory system: Secondary | ICD-10-CM | POA: Insufficient documentation

## 2019-11-21 LAB — COMPREHENSIVE METABOLIC PANEL
ALT: 21 U/L (ref 0–44)
AST: 18 U/L (ref 15–41)
Albumin: 3.9 g/dL (ref 3.5–5.0)
Alkaline Phosphatase: 76 U/L (ref 38–126)
Anion gap: 11 (ref 5–15)
BUN: 14 mg/dL (ref 6–20)
CO2: 23 mmol/L (ref 22–32)
Calcium: 9 mg/dL (ref 8.9–10.3)
Chloride: 104 mmol/L (ref 98–111)
Creatinine, Ser: 0.63 mg/dL (ref 0.61–1.24)
GFR calc Af Amer: >60 mL/min (ref >60)
GFR calc non Af Amer: >60 mL/min (ref >60)
Glucose, Bld: 135 mg/dL — ABNORMAL HIGH (ref 70–99)
Potassium: 4 mmol/L (ref 3.5–5.1)
Sodium: 138 mmol/L (ref 135–145)
Total Bilirubin: 0.5 mg/dL (ref 0.3–1.2)
Total Protein: 7.1 g/dL (ref 6.5–8.1)

## 2019-11-21 LAB — BRAIN NATRIURETIC PEPTIDE: B Natriuretic Peptide: 20.5 pg/mL (ref 0.0–100.0)

## 2019-11-21 LAB — DIGOXIN LEVEL: Digoxin Level: 0.4 ng/mL — ABNORMAL LOW (ref 0.8–2.0)

## 2019-11-21 MED ORDER — SILDENAFIL CITRATE 100 MG PO TABS: 100.0000 mg | ORAL_TABLET | Freq: Every day | ORAL | 0 refills | Status: DC | PRN

## 2019-11-21 MED ORDER — CARVEDILOL 12.5 MG PO TABS: 12.5000 mg | ORAL_TABLET | Freq: Two times a day (BID) | ORAL | 6 refills | Status: DC

## 2019-11-21 NOTE — Progress Notes (Signed)
ADVANCED HF CLINIC NOTE  Referring Physician: Dr Domenic Polite Primary Care: Dr Gerarda Fraction Primary Cardiologist: Dr Domenic Polite   HPI: Mr Fly has been referred to the HF clinic by Dr Domenic Polite for HF consultation.   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  Admitted to [facility] 05/16/19 with increased dyspnea in the setting of CAP. Treated with antibiotics.  ECHO  showed reduced LVEF 20%. Cardiology was consulted. Diuresed with IV lasix 20 pounds.   Underwent R/L cath 05/30/19   Prox RCA lesion is 20% stenosed.  Ost LAD to Prox LAD lesion is 20% stenosed.  Mid LAD lesion is 20% stenosed.   Findings:  RA = 7 RV = 48/10 PA = 45/19 (32) PCW = 22 Fick cardiac output/index = 6.2/2.6 PVR = 1.8 WU FA sat = 86% PA sat = 57%, 62%  Assessment:  1. Minimal non-obstructive CAD 2. Severe NICM EF 20% (suspect PVC or HTN mediated) 3. Relatively well-compensated hemodynamics    Echo 1/21 EF 25-30% LV still dilated. RV normal.   Returns for routine f/u. Continues to feel better. Has been weed eating, cutting grass, walking the dog. Takes his time. If he pushes it will get tired. No SOB, orthopnea or PND. Taking all meds. Weight stable. Using CPAP every night.    Cardiac Testing  ECHO 05/17/19 EF LVEF 15-20% RV moderately reduced.  Zio 07/07/19 1. Sinus rhythm -  avg HR of 96 2. One run of SVT lasting 6 beats with a max rate of 167 bpm (avg 149 bpm). 3. Occasional multifocal PVCs (4.8% burden) 4. Two patient diary events both associated with PVCs 5. No high-grade arrhythmias    Past Medical History:  Diagnosis Date  . Tobacco use     Current Outpatient Medications  Medication Sig Dispense Refill  . carvedilol (COREG) 6.25 MG tablet Take 1 tablet (6.25 mg total) by mouth 2 (two) times daily with a meal. 60 tablet 5  . digoxin (LANOXIN) 0.125 MG tablet Take 1 tablet (0.125 mg total) by mouth daily. 90 tablet 3  .  sacubitril-valsartan (ENTRESTO) 97-103 MG Take 1 tablet by mouth 2 (two) times daily. 60 tablet 6  . spironolactone (ALDACTONE) 25 MG tablet Take 1 tablet (25 mg total) by mouth daily. 90 tablet 3   No current facility-administered medications for this encounter.    No Known Allergies    Social History   Socioeconomic History  . Marital status: Married    Spouse name: Not on file  . Number of children: Not on file  . Years of education: Not on file  . Highest education level: Not on file  Occupational History  . Not on file  Tobacco Use  . Smoking status: Current Every Day Smoker    Packs/day: 1.50    Years: 33.00    Pack years: 49.50    Types: Cigarettes  . Smokeless tobacco: Never Used  Substance and Sexual Activity  . Alcohol use: No  . Drug use: No  . Sexual activity: Not on file  Other Topics Concern  . Not on file  Social History Narrative  . Not on file   Social Determinants of Health   Financial Resource Strain: Medium Risk  . Difficulty of Paying Living Expenses: Somewhat hard  Food Insecurity: No Food Insecurity  . Worried About Charity fundraiser in the Last Year: Never true  . Ran Out of Food in the Last Year:  Never true  Transportation Needs: No Transportation Needs  . Lack of Transportation (Medical): No  . Lack of Transportation (Non-Medical): No  Physical Activity: Sufficiently Active  . Days of Exercise per Week: 5 days  . Minutes of Exercise per Session: 30 min  Stress: No Stress Concern Present  . Feeling of Stress : Not at all  Social Connections:   . Frequency of Communication with Friends and Family:   . Frequency of Social Gatherings with Friends and Family:   . Attends Religious Services:   . Active Member of Clubs or Organizations:   . Attends Archivist Meetings:   Marland Kitchen Marital Status:   Intimate Partner Violence:   . Fear of Current or Ex-Partner:   . Emotionally Abused:   Marland Kitchen Physically Abused:   . Sexually Abused:        Family History  Problem Relation Age of Onset  . Heart failure Mother   . COPD Father     Vitals:   11/21/19 1011  BP: 130/76  Pulse: 90  SpO2: 99%  Weight: 115.6 kg (254 lb 12.8 oz)   Wt Readings from Last 3 Encounters:  11/21/19 115.6 kg (254 lb 12.8 oz)  08/17/19 114.2 kg (251 lb 12.8 oz)  07/19/19 114 kg (251 lb 6.4 oz)    PHYSICAL EXAM: General:  Well appearing. No resp difficulty HEENT: normal Neck: supple. no JVD. Carotids 2+ bilat; no bruits. No lymphadenopathy or thryomegaly appreciated. Cor: PMI nondisplaced. Regular rate & rhythm. Occasional ectopy No rubs, gallops or murmurs. Lungs: clear Abdomen: soft, nontender, nondistended. No hepatosplenomegaly. No bruits or masses. Good bowel sounds. Extremities: no cyanosis, clubbing, rash, edema Neuro: alert & orientedx3, cranial nerves grossly intact. moves all 4 extremities w/o difficulty. Affect pleasant   ASSESSMENT & PLAN:  1. Chronic Systolic HF - ECHO [id number] EF 15% with RV dysfunction. ? PVC or HTN - Cath 10/20 minimal CAD. Well compensated hemodynamics  - Doing much better. NYHA II - Echo 1/21 EF 25-30% RV ok  - Increase carvedilol to 12.5 bid - Continue digoxin 0.125 mg daily. - Continue 12.5 mg spironolactone daily. Potassium 4.6-5.4 unable to titrate  - Continue Entresto 97/103 bid - Consider SGLT2i at next visit - Have decided to give GDMT more time to work and not proceed with ICD at this point.  - Labs today repeat echo at next visit to reassess EF in 2-3 months,   2. CAD - minimal by cath 10/20  3. OSA - compliant with CPAP nightly   4. Smoker  - Remains quit.   5. PVCs  - Possible cause of CM - Zio patch 12/20  4.8% PVCs -> should improve with CPAP  6. ED - sildenafil 100mg  tabs PRN  Glori Bickers MD 10:51 AM

## 2019-11-21 NOTE — Patient Instructions (Addendum)
Increase Carvedilol to 12.5 mg Twice daily   We have provided you a written prescription for Sildenafil so you can take it to which ever pharmacy will give you a better discount  Labs done today, we will notify you for abnormal results  Your physician recommends that you schedule a follow-up appointment in: 3 months with echocardiogram  If you have any questions or concerns before your next appointment please send [place] a message through [place] or call our office at [phone].  At the [facility], you and your health needs are our priority. As part of our continuing mission to provide you with exceptional heart care, we have created designated Provider Care Teams. These Care Teams include your primary Cardiologist (physician) and Advanced Practice Providers (APPs- Physician Assistants and Nurse Practitioners) who all work together to provide you with the care you need, when you need it.   You may see any of the following providers on your designated Care Team at your next follow up: Marland Kitchen Dr Glori Bickers . Dr Loralie Champagne . Darrick Grinder, NP . Lyda Jester, PA . Audry Riles, PharmD   Please be sure to bring in all your medications bottles to every appointment.

## 2019-11-21 NOTE — Addendum Note (Signed)
Encounter addended by: Scarlette Calico, RN on: 11/21/2019 11:14 AM  Actions taken: Order list changed, Diagnosis association updated, Clinical Note Signed, Charge Capture section accepted

## 2019-11-28 ENCOUNTER — Telehealth (HOSPITAL_COMMUNITY): Payer: Self-pay | Admitting: Vascular Surgery

## 2019-11-28 NOTE — Telephone Encounter (Signed)
Pt called to check on disability paper work, pt would like to be contacted when paper work is signed

## 2019-11-29 NOTE — Telephone Encounter (Signed)
Disability forms completed and signed by Dr Haroldine Laws, forms along with last OV faxed to [place] at [phone]. Pt is aware this has been done

## 2020-02-20 ENCOUNTER — Encounter (HOSPITAL_COMMUNITY): Payer: Self-pay | Admitting: Internal Medicine

## 2020-02-20 ENCOUNTER — Telehealth (HOSPITAL_COMMUNITY): Payer: Self-pay | Admitting: Pharmacy Technician

## 2020-02-20 ENCOUNTER — Ambulatory Visit (HOSPITAL_BASED_OUTPATIENT_CLINIC_OR_DEPARTMENT_OTHER)
Admission: RE | Admit: 2020-02-20 | Discharge: 2020-02-20 | Disposition: A | Discharge status: Home / Self Care | Payer: Self-pay | Source: Ambulatory Visit | Attending: Internal Medicine | Admitting: Internal Medicine

## 2020-02-20 ENCOUNTER — Ambulatory Visit (HOSPITAL_COMMUNITY)
Admission: RE | Admit: 2020-02-20 | Discharge: 2020-02-20 | Disposition: A | Discharge status: Home / Self Care | Payer: Self-pay | Source: Ambulatory Visit | Attending: Internal Medicine | Admitting: Internal Medicine

## 2020-02-20 ENCOUNTER — Other Ambulatory Visit: Payer: Self-pay

## 2020-02-20 VITALS — BP 128/64 | HR 76 | Wt 257.0 lb

## 2020-02-20 DIAGNOSIS — I110 Hypertensive heart disease with heart failure: Secondary | ICD-10-CM | POA: Insufficient documentation

## 2020-02-20 DIAGNOSIS — Z79899 Other long term (current) drug therapy: Secondary | ICD-10-CM | POA: Insufficient documentation

## 2020-02-20 DIAGNOSIS — I493 Ventricular premature depolarization: Secondary | ICD-10-CM | POA: Insufficient documentation

## 2020-02-20 DIAGNOSIS — I428 Other cardiomyopathies: Secondary | ICD-10-CM | POA: Insufficient documentation

## 2020-02-20 DIAGNOSIS — G4730 Sleep apnea, unspecified: Secondary | ICD-10-CM

## 2020-02-20 DIAGNOSIS — I5022 Chronic systolic (congestive) heart failure: Secondary | ICD-10-CM

## 2020-02-20 DIAGNOSIS — G4733 Obstructive sleep apnea (adult) (pediatric): Secondary | ICD-10-CM | POA: Insufficient documentation

## 2020-02-20 DIAGNOSIS — I2510 Atherosclerotic heart disease of native coronary artery without angina pectoris: Secondary | ICD-10-CM | POA: Insufficient documentation

## 2020-02-20 DIAGNOSIS — F17210 Nicotine dependence, cigarettes, uncomplicated: Secondary | ICD-10-CM | POA: Insufficient documentation

## 2020-02-20 DIAGNOSIS — Z8249 Family history of ischemic heart disease and other diseases of the circulatory system: Secondary | ICD-10-CM | POA: Insufficient documentation

## 2020-02-20 LAB — ECHOCARDIOGRAM COMPLETE
Area-P 1/2: 3.23 cm2
Calc EF: 43.2 %
S' Lateral: 4.6 cm
Single Plane A2C EF: 45.5 %
Single Plane A4C EF: 45.1 %

## 2020-02-20 LAB — BASIC METABOLIC PANEL
Anion gap: 9 (ref 5–15)
BUN: 12 mg/dL (ref 6–20)
CO2: 27 mmol/L (ref 22–32)
Calcium: 9.6 mg/dL (ref 8.9–10.3)
Chloride: 105 mmol/L (ref 98–111)
Creatinine, Ser: 0.68 mg/dL (ref 0.61–1.24)
GFR calc Af Amer: >60 mL/min (ref >60)
GFR calc non Af Amer: >60 mL/min (ref >60)
Glucose, Bld: 87 mg/dL (ref 70–99)
Potassium: 3.9 mmol/L (ref 3.5–5.1)
Sodium: 141 mmol/L (ref 135–145)

## 2020-02-20 LAB — BRAIN NATRIURETIC PEPTIDE: B Natriuretic Peptide: 26.2 pg/mL (ref 0.0–100.0)

## 2020-02-20 MED ORDER — CARVEDILOL 12.5 MG PO TABS: 18.7500 mg | ORAL_TABLET | Freq: Two times a day (BID) | ORAL | 6 refills | Status: DC

## 2020-02-20 MED ORDER — DAPAGLIFLOZIN PROPANEDIOL 10 MG PO TABS: 10.0000 mg | ORAL_TABLET | Freq: Every day | ORAL | 3 refills | Status: DC

## 2020-02-20 MED ORDER — SPIRONOLACTONE 25 MG PO TABS: 25.0000 mg | ORAL_TABLET | Freq: Every day | ORAL | 3 refills | Status: DC

## 2020-02-20 MED FILL — CARVEDILOL 12.5 MG TABLET: 12.5 | 30 days supply | Qty: 90 | Fill #0

## 2020-02-20 MED FILL — FARXIGA 10 MG TABLET: 10 | 30 days supply | Qty: 30 | Fill #0

## 2020-02-20 MED FILL — SPIRONOLACTONE 25 MG TABS: 25 | 30 days supply | Qty: 30 | Fill #0

## 2020-02-20 NOTE — Telephone Encounter (Signed)
Saw patient in clinic today. He is without insurance so we started an application for [place] assistance with [place].  Will follow up on the status of the application.

## 2020-02-20 NOTE — Addendum Note (Signed)
Encounter addended by: Kerry Dory, CMA on: 02/20/2020 12:50 PM  Actions taken: Medication long-term status modified, Pharmacy for encounter modified, Order list changed, Diagnosis association updated

## 2020-02-20 NOTE — Patient Instructions (Signed)
START Farxiga 10 mg, one tab daily INCREASE Coreg to 18.75 mg, one and one half tab twice daily STOP Digoxin  Labs today We will only contact you if something comes back abnormal or we need to make some changes. Otherwise no news is good news!  Your physician recommends that you schedule a follow-up appointment in: 2 months with the pharmacy team  Your physician recommends that you schedule a follow-up appointment in: 6 months with Dr Haroldine Laws  Do the following things EVERYDAY: 1) Weigh yourself in the morning before breakfast. Write it down and keep it in a log. 2) Take your medicines as prescribed 3) Eat low salt foods--Limit salt (sodium) to 2000 mg per day.  4) Stay as active as you can everyday 5) Limit all fluids for the day to less than 2 liters

## 2020-02-20 NOTE — Progress Notes (Signed)
ADVANCED HF CLINIC NOTE  Referring Physician: Dr Domenic Polite Primary Care: Dr Gerarda Fraction Primary Cardiologist: Dr Domenic Polite   HPI: Logan Kennedy has been referred to the HF clinic by Dr Domenic Polite for HF consultation.   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  Admitted to [facility] 05/16/19 with increased dyspnea in the setting of CAP. Treated with antibiotics.  ECHO  showed reduced LVEF 20%. Cardiology was consulted. Diuresed with IV lasix 20 pounds.   Underwent R/L cath 05/30/19   Prox RCA lesion is 20% stenosed.  Ost LAD to Prox LAD lesion is 20% stenosed.  Mid LAD lesion is 20% stenosed.   Findings:  RA = 7 RV = 48/10 PA = 45/19 (32) PCW = 22 Fick cardiac output/index = 6.2/2.6 PVR = 1.8 WU FA sat = 86% PA sat = 57%, 62%  Assessment:  1. Minimal non-obstructive CAD 2. Severe NICM EF 20% (suspect PVC or HTN mediated) 3. Relatively well-compensated hemodynamics    Echo 1/21 EF 25-30% LV still dilated. RV normal.   Returns for routine f/u. Feels great. Remains active walking the dog and working in the yard. Takes his time. Mildly winded if goes too fast.  Wants to go back to work. No edema, orthopnea or PND. Compliant with all meds.   Echo today 02/20/20: EF 45% RV normal. Personally reviewed  Cardiac Testing  ECHO 05/17/19 EF LVEF 15-20% RV moderately reduced.  Zio 07/07/19 1. Sinus rhythm -  avg HR of 96 2. One run of SVT lasting 6 beats with a max rate of 167 bpm (avg 149 bpm). 3. Occasional multifocal PVCs (4.8% burden) 4. Two patient diary events both associated with PVCs 5. No high-grade arrhythmias    Past Medical History:  Diagnosis Date  . Tobacco use     Current Outpatient Medications  Medication Sig Dispense Refill  . carvedilol (COREG) 12.5 MG tablet Take 1 tablet (12.5 mg total) by mouth 2 (two) times daily with a meal. 60 tablet 6  . digoxin (LANOXIN) 0.125 MG tablet Take 1 tablet (0.125 mg  total) by mouth daily. 90 tablet 3  . sacubitril-valsartan (ENTRESTO) 97-103 MG Take 1 tablet by mouth 2 (two) times daily. 60 tablet 6  . sildenafil (VIAGRA) 100 MG tablet Take 1 tablet (100 mg total) by mouth daily as needed for erectile dysfunction. 30 tablet 0  . spironolactone (ALDACTONE) 25 MG tablet Take 1 tablet (25 mg total) by mouth daily. 90 tablet 3   No current facility-administered medications for this encounter.    No Known Allergies    Social History   Socioeconomic History  . Marital status: Married    Spouse name: Not on file  . Number of children: Not on file  . Years of education: Not on file  . Highest education level: Not on file  Occupational History  . Not on file  Tobacco Use  . Smoking status: Current Every Day Smoker    Packs/day: 1.50    Years: 33.00    Pack years: 49.50    Types: Cigarettes  . Smokeless tobacco: Never Used  Substance and Sexual Activity  . Alcohol use: No  . Drug use: No  . Sexual activity: Not on file  Other Topics Concern  . Not on file  Social History Narrative  . Not on file   Social Determinants of Health   Financial Resource Strain: Medium Risk  . Difficulty of Paying Living  Expenses: Somewhat hard  Food Insecurity: No Food Insecurity  . Worried About Charity fundraiser in the Last Year: Never true  . Ran Out of Food in the Last Year: Never true  Transportation Needs: No Transportation Needs  . Lack of Transportation (Medical): No  . Lack of Transportation (Non-Medical): No  Physical Activity: Sufficiently Active  . Days of Exercise per Week: 5 days  . Minutes of Exercise per Session: 30 min  Stress: No Stress Concern Present  . Feeling of Stress : Not at all  Social Connections:   . Frequency of Communication with Friends and Family:   . Frequency of Social Gatherings with Friends and Family:   . Attends Religious Services:   . Active Member of Clubs or Organizations:   . Attends Archivist  Meetings:   Marland Kitchen Marital Status:   Intimate Partner Violence:   . Fear of Current or Ex-Partner:   . Emotionally Abused:   Marland Kitchen Physically Abused:   . Sexually Abused:       Family History  Problem Relation Age of Onset  . Heart failure Mother   . COPD Father     Vitals:   02/20/20 1206  BP: 128/64  Pulse: 76  SpO2: 95%  Weight: 116.6 kg (257 lb)   Wt Readings from Last 3 Encounters:  02/20/20 116.6 kg (257 lb)  11/21/19 115.6 kg (254 lb 12.8 oz)  08/17/19 114.2 kg (251 lb 12.8 oz)    PHYSICAL EXAM: General:  Well appearing. No resp difficulty HEENT: normal Neck: supple. no JVD. Carotids 2+ bilat; no bruits. No lymphadenopathy or thryomegaly appreciated. Cor: PMI nondisplaced. Regular rate & rhythm. No rubs, gallops or murmurs. Lungs: clear Abdomen: obese soft, nontender, nondistended. No hepatosplenomegaly. No bruits or masses. Good bowel sounds. Extremities: no cyanosis, clubbing, rash, edema Neuro: alert & orientedx3, cranial nerves grossly intact. moves all 4 extremities w/o difficulty. Affect pleasant   ASSESSMENT & PLAN:  1. Chronic Systolic HF - ECHO 21/30/8657 EF 15% with RV dysfunction. ? PVC or HTN - Cath 10/20 minimal CAD. Well compensated hemodynamics  - Stable NYHA II - Echo 1/21 EF 25-30% RV ok  - Echo today 02/20/20: EF 45% RV normal. Personally reviewed - Increase carvedilol to 18.75 bid - Stop digoxin  - Continue 25 mg spironolactone daily. - Continue Entresto 97/103 bid - Add Farxiga 10. PharmD to see to help with access - Out of ICD range. EF 45%. Ok to re-apply for CDL from my standpoint  2. CAD - minimal by cath 10/20 - no s/s angina  3. OSA - compliant with CPAP  4. Smoker  - Remains quit.   5. PVCs  - Possible cause of CM - Zio patch 12/20  4.8% PVCs -> should improve with CPAP  6. ED - sildenafil 100mg  tabs PRN  Glori Bickers MD 12:19 PM

## 2020-02-20 NOTE — Progress Notes (Signed)
  Echocardiogram 2D Echocardiogram has been performed.  Jennette Dubin 02/20/2020, 11:00 AM

## 2020-02-24 NOTE — Telephone Encounter (Signed)
Sent in application via fax.  Will follow up.  

## 2020-03-09 NOTE — Telephone Encounter (Signed)
Spoke with AZ&[place], patient was conditionally approved from 07/27-10/25. Patient needs to apply for and be denied for Medicaid before he can be approved.  Will follow up with the patient.

## 2020-03-12 NOTE — Telephone Encounter (Signed)
I spoke with the patient today. He went to DSS last week to complete the application for Medicaid. It will take about 45 days to be denied he was told.   I asked him if he would call me once he got an approval or denial letter from [facility] so that we could proceed with next steps.  He was agreeable to this plan.  Charlann Boxer, CPhT

## 2020-03-15 MED FILL — SPIRONOLACTONE 25 MG TABS: 25 | 30 days supply | Qty: 30 | Fill #1

## 2020-03-15 MED FILL — CARVEDILOL 12.5 MG TABLET: 12.5 | 30 days supply | Qty: 90 | Fill #1

## 2020-03-21 ENCOUNTER — Encounter (HOSPITAL_COMMUNITY): Payer: Self-pay | Admitting: *Deleted

## 2020-04-06 NOTE — Progress Notes (Signed)
Referring Physician: Dr Domenic Polite Primary Care: Dr Gerarda Fraction Primary Cardiologist: Dr Domenic Polite   HPI:  Logan Kennedy has been referred to the HF clinic by Dr Domenic Polite for HF consultation.   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  Admitted to [facility] 05/16/19 with increased dyspnea in the setting of CAP. Treated with antibiotics.  ECHO showed reduced LVEF 20%. Cardiology was consulted. Diuresed with IV lasix 20 pounds.   Echo 1/21 EF 25-30% LV still dilated. RV normal.   Recently returned to HF Clinic for follow up with Dr. Haroldine Laws on 02/20/20. Reported feeling great. Remained active walking the dog and working in the yard. He reported becoming mildly winded if goes too fast.  Wanted to go back to work. No edema, orthopnea or PND. Compliant with all meds.   Echo 02/20/20: EF 45% RV normal.  Today he returns to HF clinic for pharmacist medication titration. At last visit with MD, carvedilol was increased to 18.75 mg BID and dapagliflozin 10 mg daily was initiated. Digoxin was discontinued as patient now has stable NYHA class II HF. Overall he is doing well today. Has gone back to work 3 days per week. No dizziness, lightheadedness, chest pain or palpitations. No SOB/DOE. No LEE, PND or orthopnea. Taking all medications as prescribed.   HF Medications: Carvedilol 18.75 mg BID Entresto 97/103 mg BID Spironolactone 25 mg daily Dapagliflozin 10 mg daily  Has the patient been experiencing any side effects to the medications prescribed?  Yes - erectile dysfunction with carvedilol. Now taking sildenafil.   Does the patient have any problems obtaining medications due to transportation or finances?   No prescription insurance. Gets Entresto through [employer] patient assistance. Other HF medications through [place].   Understanding of regimen: good Understanding of indications: good Potential of compliance:  good Patient understands to avoid NSAIDs. Patient understands to avoid decongestants.    Pertinent Lab Values (02/20/20): Marland Kitchen Serum creatinine 0.68, BUN 12, Potassium 3.9, Sodium 141  Vital Signs: . Weight: 252.8 lbs (last clinic weight: 257 lbs) . Blood pressure: 124/72  . Heart rate: 76   Assessment: 1. Chronic Systolic HF - ECHO 74/82/7078 EF 15% with RV dysfunction. ? PVC or HTN - Cath 10/20 minimal CAD. Well compensated hemodynamics  - Echo 1/21 EF 25-30% RV ok  - Echo 02/20/20: EF 45% RV normal.  - Stable NYHA II, euvolemic on exam - Increase carvedilol to 25 mg BID - Continue Entresto 97/103 mg BID - Continue spironolactone 25 mg daily. - Continue dapagliflozin 10 mg daily - Out of ICD range. EF 45%.   2. CAD - minimal by cath 10/20 - no s/s angina  3. OSA - compliant with CPAP  4. Smoker  - Remains quit.   5. PVCs  - Possible cause of CM - Zio patch 12/20  4.8% PVCs -> should improve with CPAP  6. ED - sildenafil 100mg  tabs PRN   Plan: 1) Medication changes: Based on clinical presentation, vital signs and recent labs will increase carvedilol to 25 mg BID 2) Follow-up: 4 months with Dr. Ballard Russell, PharmD, BCPS, [place], CPP Heart Failure Clinic Pharmacist [phone]

## 2020-04-16 ENCOUNTER — Ambulatory Visit (HOSPITAL_COMMUNITY)
Admission: RE | Admit: 2020-04-16 | Discharge: 2020-04-16 | Disposition: A | Discharge status: Home / Self Care | Payer: Self-pay | Source: Ambulatory Visit | Attending: Internal Medicine | Admitting: Internal Medicine

## 2020-04-16 ENCOUNTER — Other Ambulatory Visit: Payer: Self-pay

## 2020-04-16 ENCOUNTER — Other Ambulatory Visit (HOSPITAL_COMMUNITY): Payer: Self-pay | Admitting: Internal Medicine

## 2020-04-16 VITALS — BP 124/72 | HR 76 | Wt 252.8 lb

## 2020-04-16 DIAGNOSIS — I493 Ventricular premature depolarization: Secondary | ICD-10-CM | POA: Insufficient documentation

## 2020-04-16 DIAGNOSIS — I110 Hypertensive heart disease with heart failure: Secondary | ICD-10-CM | POA: Insufficient documentation

## 2020-04-16 DIAGNOSIS — G4733 Obstructive sleep apnea (adult) (pediatric): Secondary | ICD-10-CM | POA: Insufficient documentation

## 2020-04-16 DIAGNOSIS — Z79899 Other long term (current) drug therapy: Secondary | ICD-10-CM | POA: Insufficient documentation

## 2020-04-16 DIAGNOSIS — I5022 Chronic systolic (congestive) heart failure: Secondary | ICD-10-CM | POA: Insufficient documentation

## 2020-04-16 DIAGNOSIS — I2510 Atherosclerotic heart disease of native coronary artery without angina pectoris: Secondary | ICD-10-CM | POA: Insufficient documentation

## 2020-04-16 DIAGNOSIS — N529 Male erectile dysfunction, unspecified: Secondary | ICD-10-CM | POA: Insufficient documentation

## 2020-04-16 DIAGNOSIS — Z87891 Personal history of nicotine dependence: Secondary | ICD-10-CM | POA: Insufficient documentation

## 2020-04-16 MED ORDER — CARVEDILOL 25 MG PO TABS: 25.0000 mg | ORAL_TABLET | Freq: Two times a day (BID) | ORAL | 11 refills | Status: DC

## 2020-04-16 MED FILL — FARXIGA 10 MG TABLET: 10 | 30 days supply | Qty: 30 | Fill #1

## 2020-04-16 MED FILL — SPIRONOLACTONE 25 MG TABS: 25 | 30 days supply | Qty: 30 | Fill #2

## 2020-04-16 MED FILL — CARVEDILOL 25 MG TABLET: 25 | 30 days supply | Qty: 60 | Fill #0

## 2020-04-16 NOTE — Patient Instructions (Signed)
It was a pleasure seeing you today!  MEDICATIONS: -We are changing your medications today -Increase carvedilol to 25 mg (1 tablet) twice daily. You may take 2 tablets of the 12.5 mg strength twice daily until you pick up the new strength.  -Call if you have questions about your medications.   NEXT APPOINTMENT: Return to clinic in 4 months (January 2021) with Dr. Haroldine Laws. Please call the clinic at [phone] to schedule appointment as the January schedule has not been built yet.  In general, to take care of your heart failure: -Limit your fluid intake to 2 Liters (half-gallon) per day.   -Limit your salt intake to ideally 2-3 grams (2000-3000 mg) per day. -Weigh yourself daily and record, and bring that "weight diary" to your next appointment.  (Weight gain of 2-3 pounds in 1 day typically means fluid weight.) -The medications for your heart are to help your heart and help you live longer.   -Please contact us before stopping any of your heart medications.  Call the clinic at [phone] with questions or to reschedule future appointments.

## 2020-05-07 ENCOUNTER — Encounter (HOSPITAL_COMMUNITY): Payer: Self-pay | Admitting: *Deleted

## 2020-05-07 NOTE — Progress Notes (Signed)
Pt's disability form completed with a  RTW date of 03/21/20, signed by Dr Haroldine Laws and faxed into [employer] at [phone]

## 2020-05-14 MED FILL — FARXIGA 10 MG TABLET: 10 | 30 days supply | Qty: 30 | Fill #2

## 2020-05-14 MED FILL — CARVEDILOL 25 MG TABLET: 25 | 30 days supply | Qty: 60 | Fill #1

## 2020-05-14 MED FILL — SPIRONOLACTONE 25 MG TABS: 25 | 30 days supply | Qty: 30 | Fill #3

## 2020-06-11 ENCOUNTER — Other Ambulatory Visit (HOSPITAL_COMMUNITY): Payer: Self-pay | Admitting: Internal Medicine

## 2020-06-11 MED FILL — SPIRONOLACTONE 25 MG TABS: 25 | 30 days supply | Qty: 30 | Fill #0

## 2020-06-11 MED FILL — CARVEDILOL 25 MG TABLET: 25 | 30 days supply | Qty: 60 | Fill #2

## 2020-06-11 MED FILL — FARXIGA 10 MG TABLET: 10 | 30 days supply | Qty: 30 | Fill #3

## 2020-06-19 MED FILL — CARVEDILOL 25 MG TABLET: 25 | 30 days supply | Qty: 60 | Fill #2

## 2020-06-19 MED FILL — FARXIGA 10 MG TABLET: 10 | 30 days supply | Qty: 30 | Fill #3

## 2020-06-19 MED FILL — SPIRONOLACTONE 25 MG TABS: 25 | 30 days supply | Qty: 30 | Fill #0

## 2020-07-10 ENCOUNTER — Other Ambulatory Visit (HOSPITAL_COMMUNITY): Payer: Self-pay | Admitting: Internal Medicine

## 2020-07-10 MED FILL — SPIRONOLACTONE 25 MG TABS: 25 | 30 days supply | Qty: 30 | Fill #1

## 2020-07-10 MED FILL — CARVEDILOL 25 MG TABLET: 25 | 30 days supply | Qty: 60 | Fill #3

## 2020-07-12 MED FILL — FARXIGA 10 MG TABLET: 10 | 30 days supply | Qty: 30 | Fill #0

## 2020-07-20 ENCOUNTER — Other Ambulatory Visit (HOSPITAL_COMMUNITY): Payer: Self-pay

## 2020-07-20 MED ORDER — SACUBITRIL-VALSARTAN 97-103 MG PO TABS: 1.0000 | ORAL_TABLET | Freq: Two times a day (BID) | ORAL | 6 refills | Status: DC

## 2020-07-23 ENCOUNTER — Other Ambulatory Visit (HOSPITAL_COMMUNITY): Payer: Self-pay | Admitting: Cardiology

## 2020-07-23 DIAGNOSIS — I5022 Chronic systolic (congestive) heart failure: Secondary | ICD-10-CM

## 2020-08-08 MED FILL — CARVEDILOL 25 MG TABLET: 25 | 30 days supply | Qty: 60 | Fill #4

## 2020-08-08 MED FILL — SPIRONOLACTONE 25 MG TABS: 25 | 30 days supply | Qty: 30 | Fill #2

## 2020-08-08 MED FILL — FARXIGA 10 MG TABLET: 10 | 30 days supply | Qty: 30 | Fill #1

## 2020-08-21 ENCOUNTER — Telehealth (HOSPITAL_COMMUNITY): Payer: Self-pay | Admitting: Pharmacy Technician

## 2020-08-21 NOTE — Telephone Encounter (Signed)
Spoke with patient. He can afford his current co-pay. Stated that he will let the provider know at his next visit where he wants the [employer]. He is aware that we will no longer use [employer].  Advised him to contact me if anything changes.   Charlann Boxer, CPhT

## 2020-08-21 NOTE — Telephone Encounter (Signed)
It's time to re-enroll patient to receive medication assistance for Entresto from [employer]. Upon investigation, patient now has insurance. His current 30 day co-pay is $35. Left voicemail for patient to call me back to confirm if he can afford the current co-pay before we proceed with assistance.  Charlann Boxer, CPhT

## 2020-09-07 ENCOUNTER — Other Ambulatory Visit: Payer: Self-pay

## 2020-09-07 ENCOUNTER — Encounter (HOSPITAL_COMMUNITY): Payer: Self-pay | Admitting: Internal Medicine

## 2020-09-07 ENCOUNTER — Ambulatory Visit (HOSPITAL_COMMUNITY)
Admission: RE | Admit: 2020-09-07 | Discharge: 2020-09-07 | Disposition: A | Discharge status: Home / Self Care | Payer: Commercial Managed Care - PPO | Source: Ambulatory Visit | Attending: Internal Medicine | Admitting: Internal Medicine

## 2020-09-07 ENCOUNTER — Ambulatory Visit (HOSPITAL_BASED_OUTPATIENT_CLINIC_OR_DEPARTMENT_OTHER)
Admission: RE | Admit: 2020-09-07 | Discharge: 2020-09-07 | Disposition: A | Discharge status: Home / Self Care | Payer: Commercial Managed Care - PPO | Source: Ambulatory Visit | Attending: Internal Medicine | Admitting: Internal Medicine

## 2020-09-07 VITALS — BP 134/78 | HR 80 | Wt 252.0 lb

## 2020-09-07 DIAGNOSIS — F17210 Nicotine dependence, cigarettes, uncomplicated: Secondary | ICD-10-CM | POA: Insufficient documentation

## 2020-09-07 DIAGNOSIS — G4733 Obstructive sleep apnea (adult) (pediatric): Secondary | ICD-10-CM

## 2020-09-07 DIAGNOSIS — I5022 Chronic systolic (congestive) heart failure: Secondary | ICD-10-CM

## 2020-09-07 DIAGNOSIS — I10 Essential (primary) hypertension: Secondary | ICD-10-CM

## 2020-09-07 HISTORY — DX: Heart failure, unspecified: I50.9

## 2020-09-07 LAB — BASIC METABOLIC PANEL
Anion gap: 8 (ref 5–15)
BUN: 15 mg/dL (ref 6–20)
CO2: 26 mmol/L (ref 22–32)
Calcium: 9.7 mg/dL (ref 8.9–10.3)
Chloride: 106 mmol/L (ref 98–111)
Creatinine, Ser: 0.75 mg/dL (ref 0.61–1.24)
GFR, Estimated: >60 mL/min (ref >60)
Glucose, Bld: 96 mg/dL (ref 70–99)
Potassium: 4.4 mmol/L (ref 3.5–5.1)
Sodium: 140 mmol/L (ref 135–145)

## 2020-09-07 LAB — ECHOCARDIOGRAM COMPLETE
Area-P 1/2: 2.56 cm2
S' Lateral: 3.8 cm

## 2020-09-07 NOTE — Progress Notes (Signed)
°  Echocardiogram 2D Echocardiogram has been performed.  Randa Lynn Toniann Dickerson 09/07/2020, 10:56 AM

## 2020-09-07 NOTE — Progress Notes (Signed)
ADVANCED HF CLINIC NOTE  Referring Physician: Dr Domenic Polite Primary Care: Dr Gerarda Fraction Primary Cardiologist: Dr Domenic Polite   HPI: Logan Kennedy has been referred to the HF clinic by Dr Domenic Polite for HF consultation.   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  Admitted to [facility] 10/20 with increased dyspnea in the setting of CAP. Treated with antibiotics.  ECHO  showed reduced LVEF 20%. Cardiology was consulted. Diuresed with IV lasix 20 pounds.   Underwent R/L cath 05/30/19 mild non-obstructive CAD  Echo today 09/07/20: EF 55-60% Grade 2DD normal RV. Personally reviewed   Returns for routine f/u. Feels great. Denies CP, SOB, orthopnea or PND. No edema. Compliant with all meds. 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 Echo 1/21 EF 25-30% LV still dilated. RV normal. Echo 02/20/20: EF 45% RV normal. Personally reviewed   Underwent R/L cath 05/30/19   Prox RCA lesion is 20% stenosed.  Ost LAD to Prox LAD lesion is 20% stenosed.  Mid LAD lesion is 20% stenosed.  RA = 7 RV = 48/10 PA = 45/19 (32) PCW = 22 Fick cardiac output/index = 6.2/2.6 PVR = 1.8 WU FA sat = 86% PA sat = 57%, 62%  Assessment:  1. Minimal non-obstructive CAD 2. Severe NICM EF 20% (suspect PVC or HTN mediated) 3. Relatively well-compensated hemodynamics       Zio 07/07/19 1. Sinus rhythm -  avg HR of 96 2. One run of SVT lasting 6 beats with a max rate of 167 bpm (avg 149 bpm). 3. Occasional multifocal PVCs (4.8% burden) 4. Two patient diary events both associated with PVCs 5. No high-grade arrhythmias    Past Medical History:  Diagnosis Date  . CHF (congestive heart failure) ([place])   . Tobacco use     Current Outpatient Medications  Medication Sig Dispense Refill  . carvedilol (COREG) 25 MG tablet Take 1 tablet (25 mg total) by mouth 2 (two) times  daily with a meal. 60 tablet 11  . FARXIGA 10 MG TABS tablet TAKE 1 TABLET BY MOUTH ONCE DAILY WITH BREAKFAST. 30 tablet 3  . sacubitril-valsartan (ENTRESTO) 97-103 MG Take 1 tablet by mouth 2 (two) times daily. 60 tablet 6  . sildenafil (VIAGRA) 100 MG tablet Take 1 tablet (100 mg total) by mouth daily as needed for erectile dysfunction. 30 tablet 0  . spironolactone (ALDACTONE) 25 MG tablet TAKE 1 TABLET (25 MG TOTAL) BY MOUTH DAILY. 30 tablet 3   No current facility-administered medications for this encounter.    No Known Allergies    Social History   Socioeconomic History  . Marital status: Married    Spouse name: Not on file  . Number of children: Not on file  . Years of education: Not on file  . Highest education level: Not on file  Occupational History  . Not on file  Tobacco Use  . Smoking status: Current Every Day Smoker    Packs/day: 1.50    Years: 33.00    Pack years: 49.50    Types: Cigarettes  . Smokeless tobacco: Never Used  Substance and Sexual Activity  . Alcohol use: No  . Drug use: No  . Sexual activity: Not on file  Other Topics Concern  . Not on file  Social History Narrative  . Not on file   Social Determinants of Health  Financial Resource Strain: Medium Risk  . Difficulty of Paying Living Expenses: Somewhat hard  Food Insecurity: No Food Insecurity  . Worried About Charity fundraiser in the Last Year: Never true  . Ran Out of Food in the Last Year: Never true  Transportation Needs: No Transportation Needs  . Lack of Transportation (Medical): No  . Lack of Transportation (Non-Medical): No  Physical Activity: Sufficiently Active  . Days of Exercise per Week: 5 days  . Minutes of Exercise per Session: 30 min  Stress: No Stress Concern Present  . Feeling of Stress : Not at all  Social Connections: Not on file  Intimate Partner Violence: Not on file      Family History  Problem Relation Age of Onset  . Heart failure Mother   . COPD  Father     Vitals:   09/07/20 1111  BP: 134/78  Pulse: 80  SpO2: 96%  Weight: 114.3 kg (252 lb)   Wt Readings from Last 3 Encounters:  04/16/20 114.7 kg (252 lb 12.8 oz)  02/20/20 116.6 kg (257 lb)  11/21/19 115.6 kg (254 lb 12.8 oz)    PHYSICAL EXAM: General:  Well appearing. No resp difficulty HEENT: normal Neck: supple. no JVD. Carotids 2+ bilat; no bruits. No lymphadenopathy or thryomegaly appreciated. Cor: PMI nondisplaced. Regular rate & rhythm. No rubs, gallops or murmurs. Lungs: clear Abdomen: soft, nontender, nondistended. No hepatosplenomegaly. No bruits or masses. Good bowel sounds. Extremities: no cyanosis, clubbing, rash, edema Neuro: alert & orientedx3, cranial nerves grossly intact. moves all 4 extremities w/o difficulty. Affect pleasant  ASSESSMENT & PLAN:  1. Chronic Systolic HF - ECHO 91/63/8466 EF 15% with RV dysfunction. ? PVC or HTN - Cath 10/20 minimal CAD. Well compensated hemodynamics  - Stable NYHA II - Echo 1/21 EF 25-30% RV ok  - Echo 02/20/20: EF 45% RV normal.  - Echo today 09/07/20: EF 55-60% Grade 2DD normal RV. Personally reviewed - Continue carvedilol 18.75 bidn  - Continue 25 mg spironolactone daily. - Continue Entresto 97/103 bid - Continue Farxiga 10  - Can graduate HF Clinic. F/u with Dr. Domenic Polite.  - Labs today  2. CAD - minimal by cath 10/20 - no s/s angina  3. OSA - compliant with CPAP  4. Smoker  - Remains quit.   5. PVCs  - Possible cause of CM - Zio patch 12/20  4.8% PVCs -> should improve with CPAP  6. HTN - Blood pressure well controlled. Continue current regimen.   Glori Bickers MD 11:15 AM

## 2020-09-07 NOTE — Addendum Note (Signed)
Encounter addended by: Malena Edman, RN on: 09/07/2020 12:12 PM  Actions taken: Order list changed, Diagnosis association updated, Charge Capture section accepted, Clinical Note Signed

## 2020-09-07 NOTE — Patient Instructions (Signed)
Congratulations! You have graduated Heart failure clinic!  You have been referred to Cardiology with Dr. Domenic Polite, their office will contact you to schedule an appointment in a year.  Labs done today, your results will be available in MyChart, we will contact you for abnormal readings.  If you have any questions or concerns before your next appointment please send [place] a message through [place] or call our office at [phone].    TO LEAVE A MESSAGE FOR THE NURSE SELECT OPTION 2, PLEASE LEAVE A MESSAGE INCLUDING: . YOUR NAME . DATE OF BIRTH . CALL BACK NUMBER . REASON FOR CALL**this is important as we prioritize the call backs  YOU WILL RECEIVE A CALL BACK THE SAME DAY AS LONG AS YOU CALL BEFORE 4:00 PM

## 2020-09-17 ENCOUNTER — Telehealth (HOSPITAL_COMMUNITY): Payer: Self-pay | Admitting: Pharmacist

## 2020-09-17 MED ORDER — DAPAGLIFLOZIN PROPANEDIOL 10 MG PO TABS: ORAL_TABLET | ORAL | 11 refills | Status: DC

## 2020-09-17 MED ORDER — SACUBITRIL-VALSARTAN 97-103 MG PO TABS: 1.0000 | ORAL_TABLET | Freq: Two times a day (BID) | ORAL | 11 refills | Status: DC

## 2020-09-17 MED ORDER — SPIRONOLACTONE 25 MG PO TABS: 25.0000 mg | ORAL_TABLET | Freq: Every day | ORAL | 11 refills | Status: DC

## 2020-09-17 MED ORDER — SILDENAFIL CITRATE 100 MG PO TABS: 100.0000 mg | ORAL_TABLET | Freq: Every day | ORAL | 1 refills | Status: DC | PRN

## 2020-09-17 MED ORDER — CARVEDILOL 25 MG PO TABS: 25.0000 mg | ORAL_TABLET | Freq: Two times a day (BID) | ORAL | 11 refills | Status: DC

## 2020-09-17 MED FILL — CARVEDILOL 25 MG TABLET: 25 | 30 days supply | Qty: 60 | Fill #5

## 2020-09-17 MED FILL — FARXIGA 10 MG TABLET: 10 | 30 days supply | Qty: 30 | Fill #2

## 2020-09-17 MED FILL — SPIRONOLACTONE 25 MG TABS: 25 | 30 days supply | Qty: 30 | Fill #3

## 2020-09-17 NOTE — Telephone Encounter (Signed)
Prescription refills sent to [facility] per patient request.   Audry Riles, PharmD, BCPS, BCCP, CPP Heart Failure Clinic Pharmacist [phone]

## 2020-09-18 ENCOUNTER — Telehealth (HOSPITAL_COMMUNITY): Payer: Self-pay | Admitting: Pharmacist

## 2020-09-18 MED ORDER — SILDENAFIL CITRATE 100 MG PO TABS: 100.0000 mg | ORAL_TABLET | Freq: Every day | ORAL | 1 refills | Status: DC | PRN

## 2020-09-18 NOTE — Telephone Encounter (Signed)
Updated quantity limit on sildenafil prescription and sent to [facility].  Audry Riles, PharmD, BCPS, BCCP, CPP Heart Failure Clinic Pharmacist [phone]

## 2020-10-15 ENCOUNTER — Telehealth (HOSPITAL_COMMUNITY): Payer: Self-pay | Admitting: Pharmacist

## 2020-10-15 NOTE — Telephone Encounter (Signed)
Entered in error

## 2020-10-17 ENCOUNTER — Ambulatory Visit (HOSPITAL_COMMUNITY)
Admission: RE | Admit: 2020-10-17 | Discharge: 2020-10-17 | Disposition: A | Discharge status: Home / Self Care | Payer: Commercial Managed Care - PPO | Source: Ambulatory Visit | Attending: Internal Medicine | Admitting: Internal Medicine

## 2020-10-17 ENCOUNTER — Other Ambulatory Visit: Payer: Self-pay | Admitting: Internal Medicine

## 2020-10-17 ENCOUNTER — Other Ambulatory Visit: Payer: Self-pay

## 2020-10-17 ENCOUNTER — Other Ambulatory Visit (HOSPITAL_COMMUNITY): Payer: Self-pay | Admitting: Internal Medicine

## 2020-10-17 DIAGNOSIS — N201 Calculus of ureter: Secondary | ICD-10-CM | POA: Insufficient documentation

## 2020-12-10 ENCOUNTER — Ambulatory Visit: Payer: Commercial Managed Care - PPO | Admitting: Cardiology

## 2021-01-21 IMAGING — DX DG ABDOMEN ACUTE W/ 1V CHEST
4 series · 4 of 4 positions shown · non-contrast
Comparison: 08/18/2016

CLINICAL DATA: Abdominal pain, generalized abdominal pain for 1
month

EXAM:
DG ABDOMEN ACUTE W/ 1V CHEST

[chest pa]
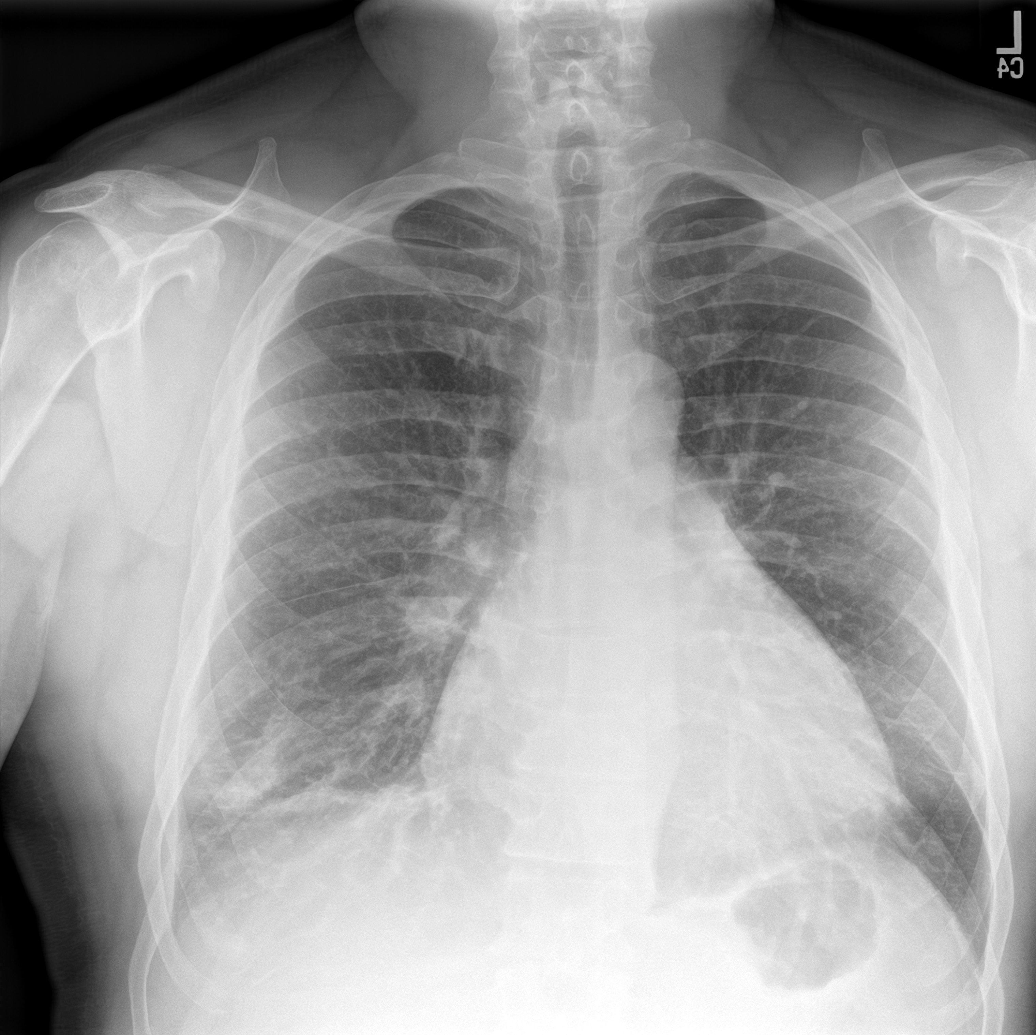

[abdomen erect]
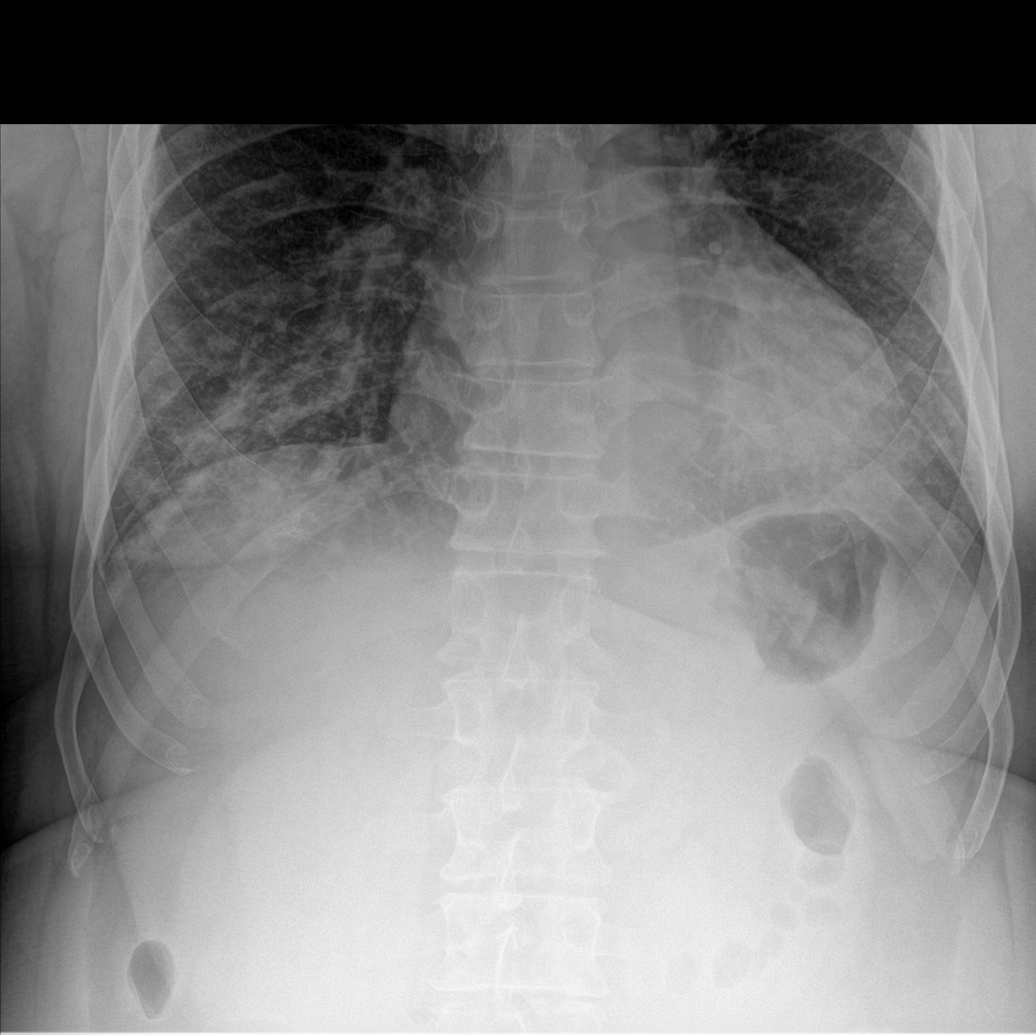

[abdomen supine (1 of 2)]
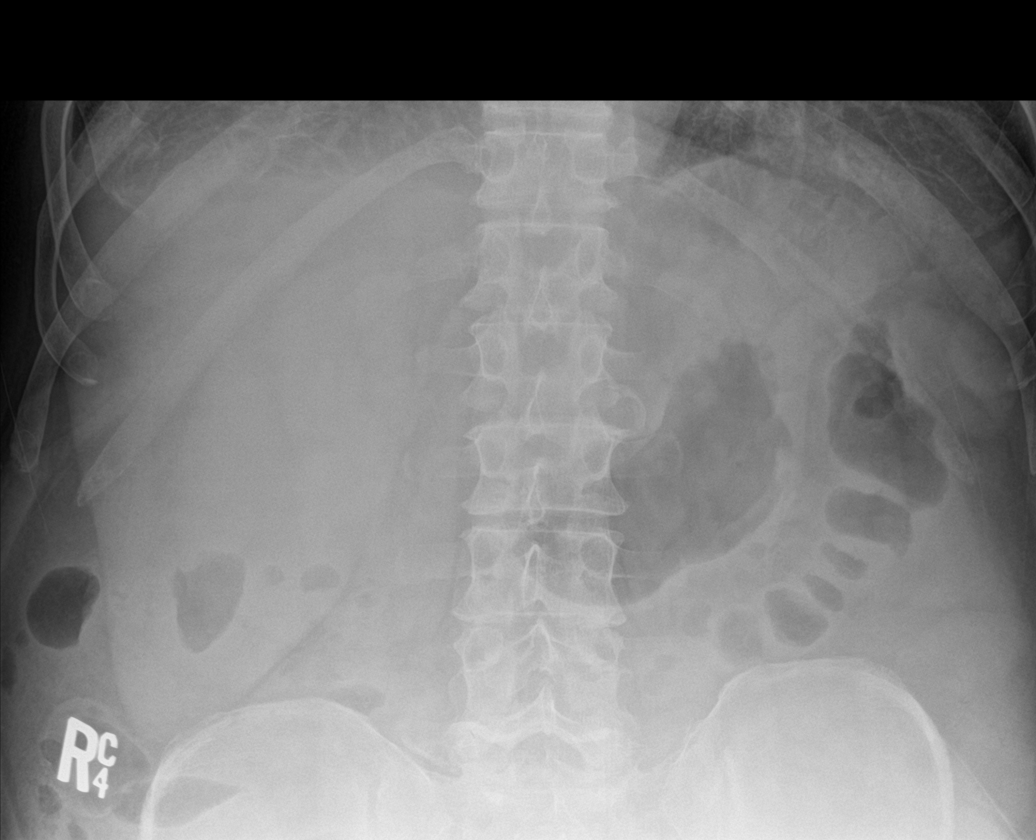

[abdomen supine (2 of 2)]
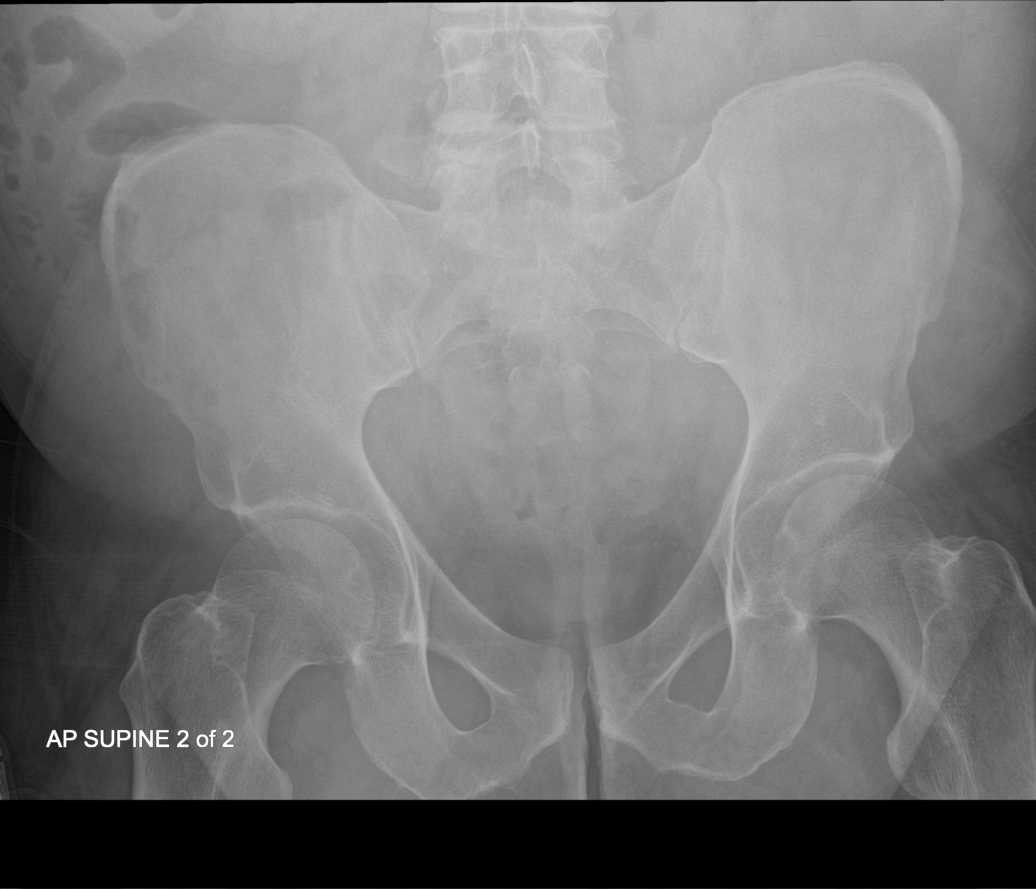

[4 of 4 positions shown; findings below may reference images not displayed]

FINDINGS: Right lower lobe airspace disease in the chest.  Mildly enlarged.

No free air beneath either right or left hemidiaphragm. No signs of
bowel obstruction.

No acute bone finding.
IMPRESSION: Right lower lobe pneumonia. Follow-up is suggested to ensure
resolution.

No acute intra-abdominal finding.

These results will be called to the ordering clinician or
representative by the Radiologist Assistant, and communication
documented in the PACS or zVision Dashboard.

## 2021-01-29 IMAGING — US US ABDOMEN LIMITED
1 series · 14 of 25 positions shown · non-contrast
Comparison: CT abdomen pelvis 05/16/2019

CLINICAL DATA: Abnormal lab values.

EXAM:
ULTRASOUND ABDOMEN LIMITED RIGHT UPPER QUADRANT

[Series 1: us abdomen limited · 0.25mm/px · 14 of 88 slices shown]
[im 1/88]
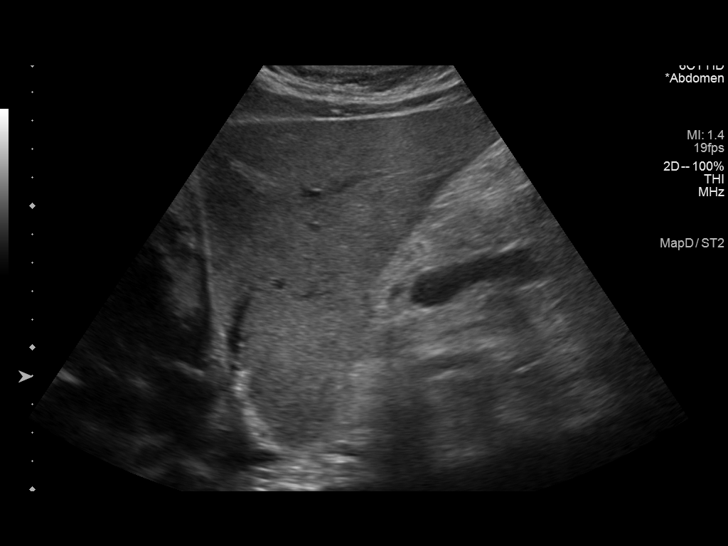
[im 8/88]
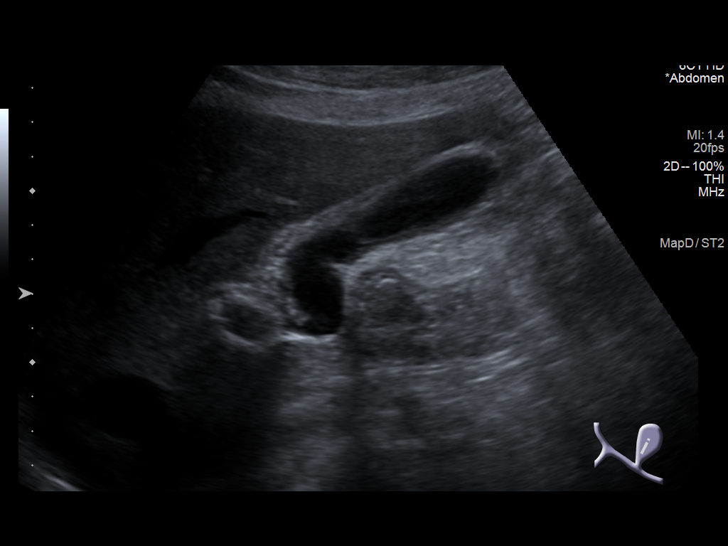
[im 15/88]
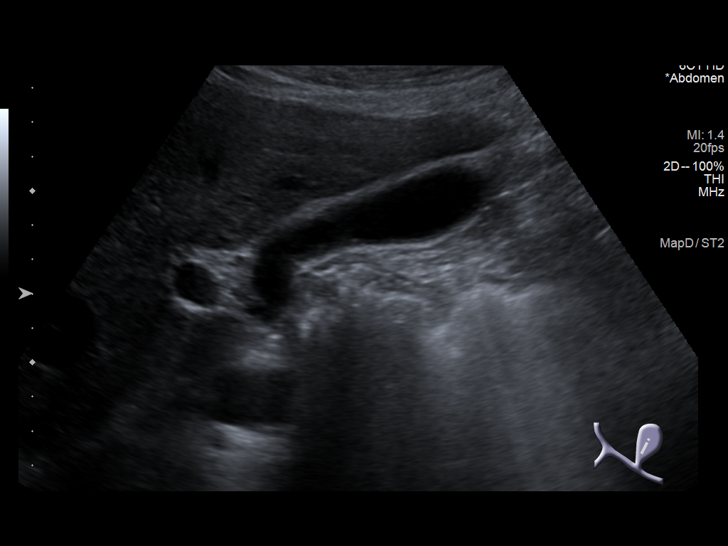
[im 22/88]
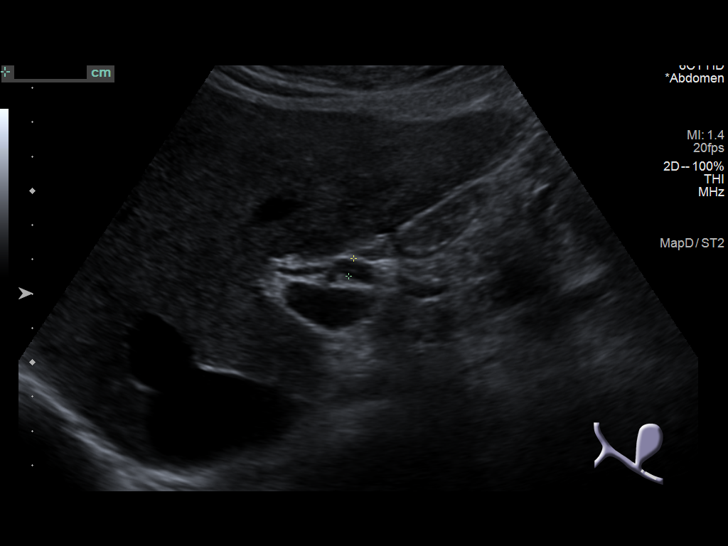
[im 30/88]
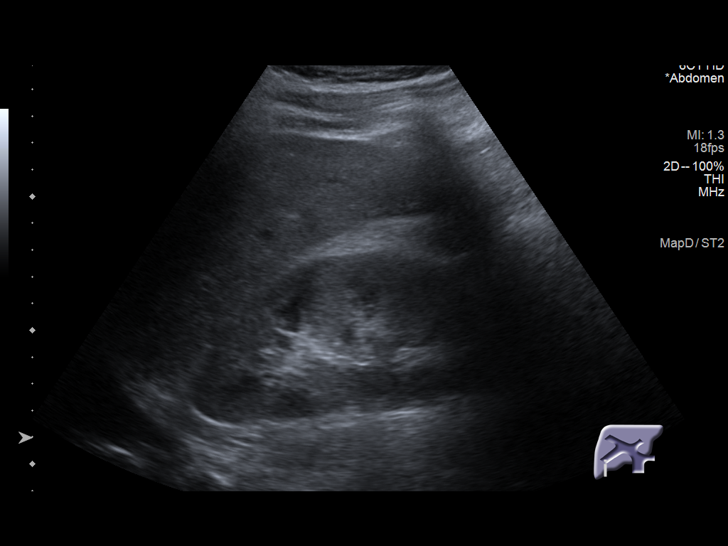
[im 33/88]
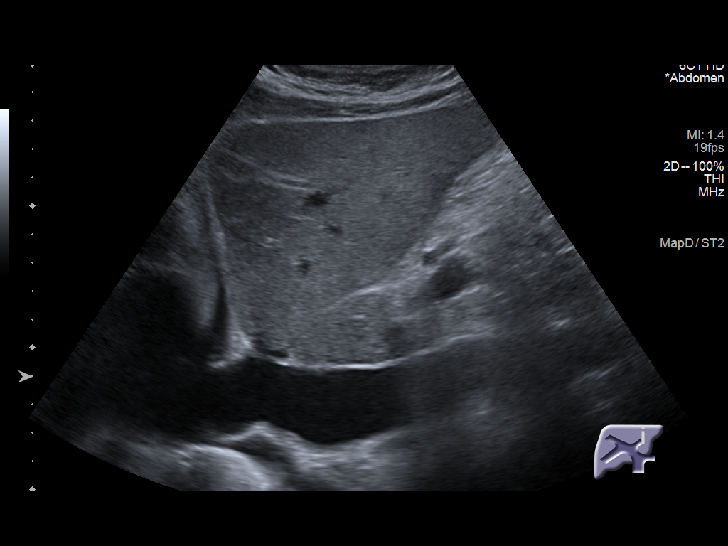
[im 40/88]
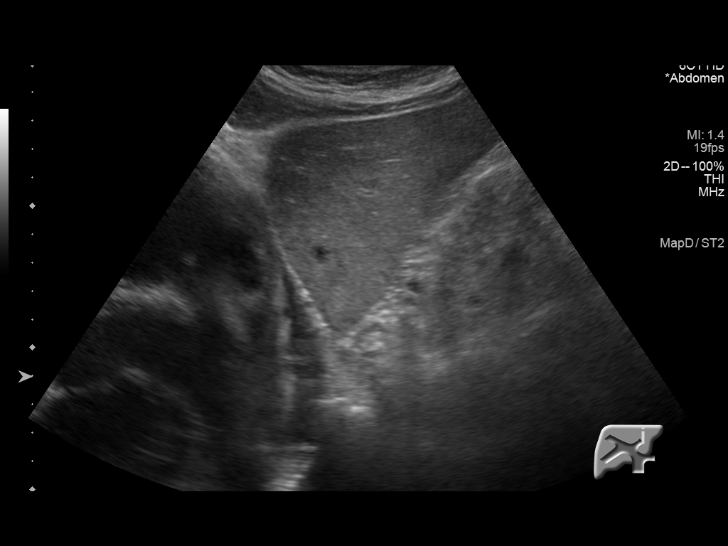
[im 48/88]
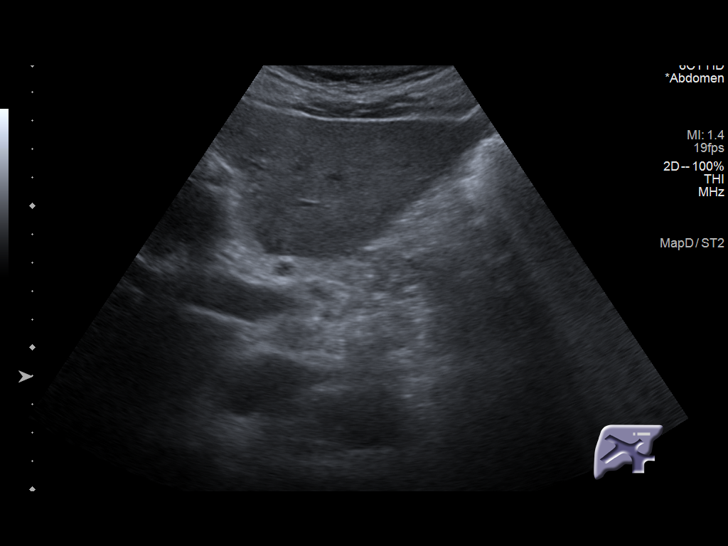
[im 55/88]
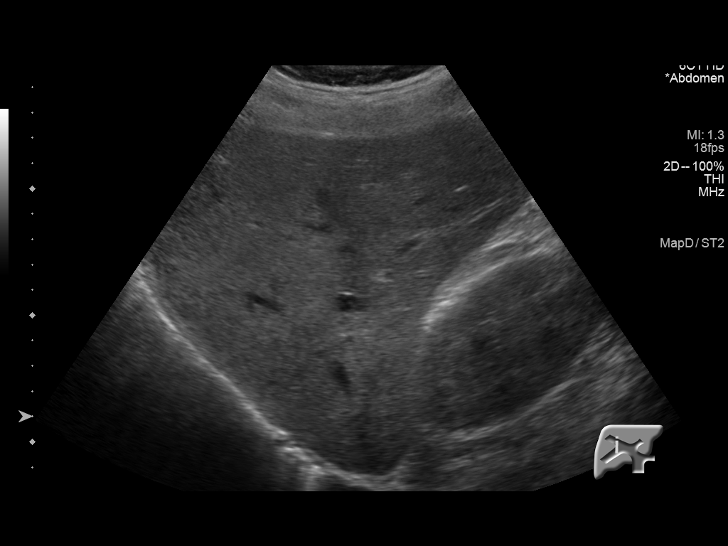
[im 59/88]
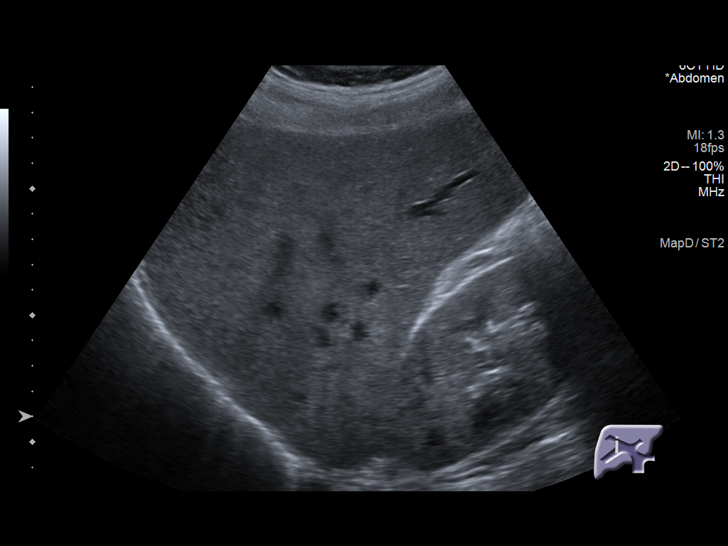
[im 66/88]
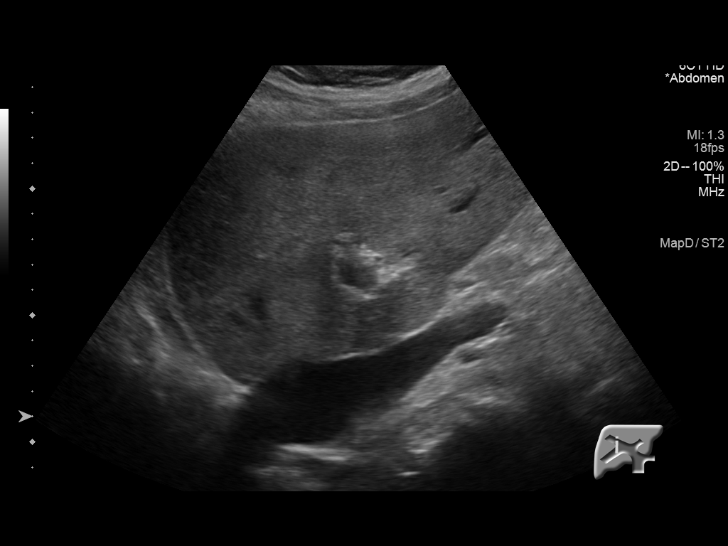
[im 73/88]
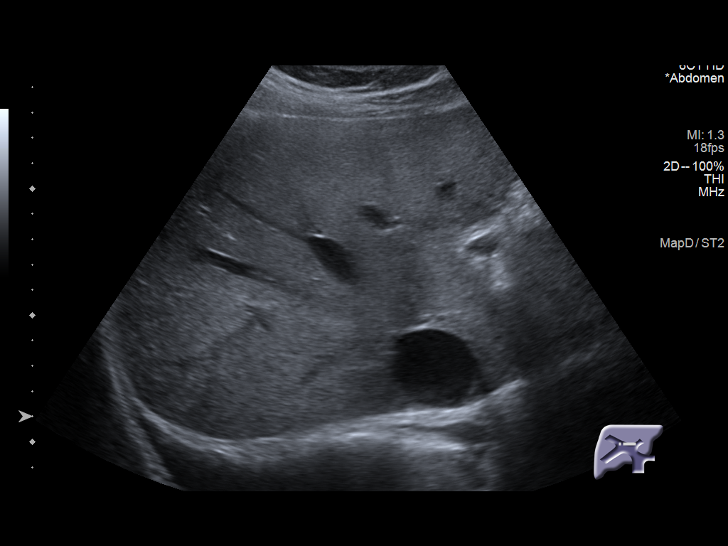
[im 80/88]
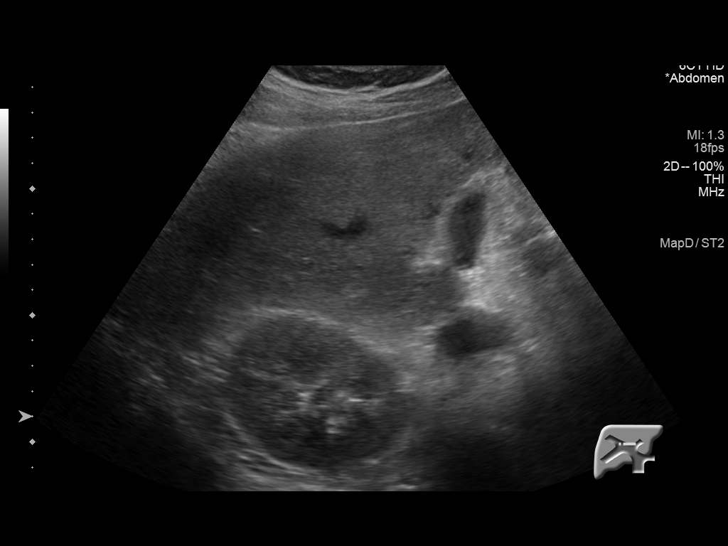
[im 88/88]
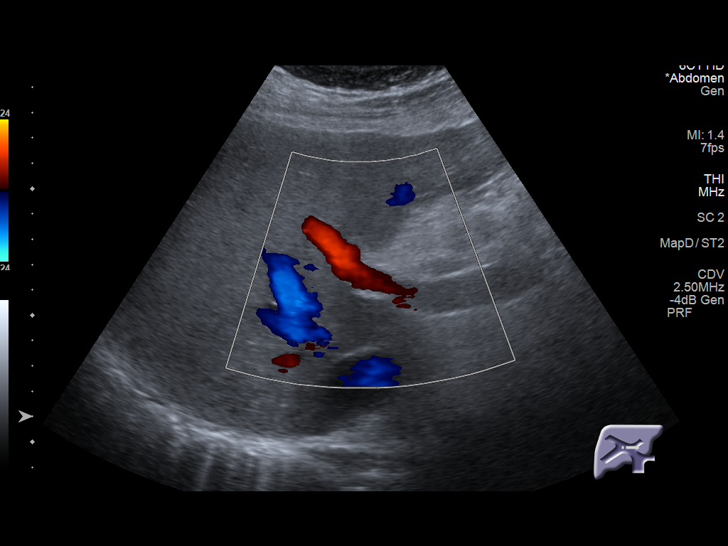

[14 of 25 positions shown; findings below may reference images not displayed]

FINDINGS: Gallbladder:

The gallbladder wall is mildly thickened measuring 0.5 cm. No
gallstones visualized. No sonographic Murphy sign noted by
sonographer.

Common bile duct:

Diameter: 0.5 cm

Liver:

No focal lesion identified. Within normal limits in parenchymal
echogenicity. Portal vein is patent on color Doppler imaging with
normal direction of blood flow towards the liver.

Other: Right pleural effusion.
IMPRESSION: Mild gallbladder wall thickening which is a nonspecific finding and
can be seen in the setting of fluid overload states,
hypoalbuminemia, liver pathology such as cirrhosis, and gallbladder
inflammation, among others. No other findings to suggest acute
cholecystitis.

## 2021-01-31 ENCOUNTER — Ambulatory Visit: Payer: Commercial Managed Care - PPO | Admitting: Cardiology

## 2021-04-28 ENCOUNTER — Encounter: Payer: Self-pay | Admitting: Cardiology

## 2021-04-28 NOTE — Progress Notes (Signed)
Cardiology Office Note  Date: 04/29/2021   ID: Logan Kennedy, DOB 1966/01/10, MRN [id number]  PCP:  Redmond School, MD  Cardiologist:  Rozann Lesches, MD Electrophysiologist:  None   Chief Complaint  Patient presents with   Cardiac follow-up     History of Present Illness: Logan Kennedy is a 55 y.o. male former patient of Dr. Haroldine Laws now presenting to establish follow-up with me.  I reviewed his records and updated the chart.  He was last seen in February.  He is here for a routine visit, continues to do very well, NYHA class I dyspnea.  He reports consistent use of CPAP, sleeping well.  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  He has a history of nonischemic cardiomyopathy.  Most recent echocardiogram in February revealed normal LVEF in the range of 55 to 60% with moderate diastolic dysfunction and normal RV contraction.  We went over his medications, he reports compliance with therapy.  I personally reviewed his ECG today which shows sinus rhythm with PVCs, decreased R wave progression.  We discussed getting follow-up lab work.  Past Medical History:  Diagnosis Date   Mild CAD    Cardiac catheterization October 2020   Nonischemic cardiomyopathy ([place])    OSA on CPAP    PVC's (premature ventricular contractions)     Past Surgical History:  Procedure Laterality Date   APPENDECTOMY     COLONOSCOPY N/A 11/14/2014   Procedure: COLONOSCOPY;  Surgeon: Aviva Signs Md, MD;  [facility];  Service: Gastroenterology;  Laterality: N/A;   RIGHT/LEFT HEART CATH AND CORONARY ANGIOGRAPHY N/A 05/30/2019   Procedure: RIGHT/LEFT HEART CATH AND CORONARY ANGIOGRAPHY;  Surgeon: Jolaine Artist, MD;  Location: [facility];  Service: Cardiovascular;  Laterality: N/A;    Current Outpatient Medications  Medication Sig Dispense Refill   carvedilol (COREG) 25 MG tablet Take 1 tablet (25 mg total) by mouth 2 (two) times daily with a meal. 60 tablet 11    dapagliflozin propanediol (FARXIGA) 10 MG TABS tablet TAKE 1 TABLET BY MOUTH ONCE DAILY WITH BREAKFAST. 30 tablet 11   sacubitril-valsartan (ENTRESTO) 97-103 MG Take 1 tablet by mouth 2 (two) times daily. 60 tablet 11   sildenafil (VIAGRA) 100 MG tablet Take 1 tablet (100 mg total) by mouth daily as needed for erectile dysfunction. 6 tablet 1   spironolactone (ALDACTONE) 25 MG tablet Take 1 tablet (25 mg total) by mouth daily. 30 tablet 11   No current facility-administered medications for this visit.   Allergies:  Patient has no known allergies.   ROS: No palpitations or syncope.  Physical Exam: VS:  BP 120/76   Pulse 75   Ht 6\' 2"  (1.88 m)   Wt 253 lb (114.8 kg)   SpO2 96%   BMI 32.48 kg/m , BMI Body mass index is 32.48 kg/m.  Wt Readings from Last 3 Encounters:  04/29/21 253 lb (114.8 kg)  09/07/20 252 lb (114.3 kg)  04/16/20 252 lb 12.8 oz (114.7 kg)    General: Patient appears comfortable at rest. HEENT: Conjunctiva and lids normal, wearing a mask. Neck: Supple, no elevated JVP or carotid bruits, no thyromegaly. Lungs: Clear to auscultation, nonlabored breathing at rest. Cardiac: Regular rate and rhythm with occasional ectopic beats, no S3 or significant systolic murmur. Extremities: No pitting edema.  ECG:  An ECG dated 06/20/2019 was personally reviewed today and demonstrated:  Sinus rhythm with diffuse nonspecific ST-T changes.  Recent Labwork: 09/07/2020: BUN  15; Creatinine, Ser 0.75; Potassium 4.4; Sodium 140   Other Studies Reviewed Today:  Cardiac catheterization 05/30/2019: Prox RCA lesion is 20% stenosed. Ost LAD to Prox LAD lesion is 20% stenosed. Mid LAD lesion is 20% stenosed.   Findings:   RA = 7 RV = 48/10 PA = 45/19 (32) PCW = 22 Fick cardiac output/index = 6.2/2.6 PVR = 1.8 WU FA sat = 86% PA sat = 57%, 62%   Assessment:   1. Minimal non-obstructive CAD 2. Severe NICM EF 20% 3. Relatively well-compensated hemodynamics  Cardiac monitor  December 2020: 1. Sinus rhythm\ -  avg HR of 96 2. One run of SVT lasting 6 beats with a max rate of 167 bpm (avg 149 bpm). 3. Occasional multifocal PVCs (4.8% burden) 4. Two patient diary events both associated with PVCs 5. No high-grade arrhythmias  Echocardiogram 09/07/2020:  1. Left ventricular ejection fraction, by estimation, is 55 to 60%. The  left ventricle has normal function. The left ventricle has no regional  wall motion abnormalities. Left ventricular diastolic parameters are  consistent with Grade II diastolic  dysfunction (pseudonormalization).   2. Right ventricular systolic function is normal. The right ventricular  size is normal.   3. Left atrial size was mildly dilated.   4. The mitral valve is normal in structure. No evidence of mitral valve  regurgitation. No evidence of mitral stenosis.   5. The aortic valve is normal in structure. Aortic valve regurgitation is  not visualized. No aortic stenosis is present.   6. The inferior vena cava is normal in size with greater than 50%  respiratory variability, suggesting right atrial pressure of 3 mmHg.   Assessment and Plan:  1.  History of nonischemic cardiomyopathy with normalization of LVEF, most recently 55 to 60% by echocardiogram in February.  He is clinically stable with NYHA class I dyspnea.  Current regimen includes Coreg, [place], Entresto, and Aldactone.  We will obtain a BMET.  Anticipate 90-month follow-up visit with echocardiogram.  2.  History of frequent PVCs, asymptomatic at this point.  Continue current dose of Coreg.  3.  OSA on CPAP.  He reports compliance with therapy.  Medication Adjustments/Labs and Tests Ordered: Current medicines are reviewed at length with the patient today.  Concerns regarding medicines are outlined above.   Tests Ordered: Orders Placed This Encounter  Procedures   Basic metabolic panel   EKG 75-IEPP   ECHOCARDIOGRAM COMPLETE     Medication Changes: No orders of the  defined types were placed in this encounter.   Disposition:  Follow up  6 months.  Signed, Satira Sark, MD, [facility] 04/29/2021 1:44 PM    [facility] [address] [place], [place] [address] Phone: [phone]; Fax: [phone]

## 2021-04-29 ENCOUNTER — Encounter: Payer: Self-pay | Admitting: Cardiology

## 2021-04-29 ENCOUNTER — Ambulatory Visit: Payer: Commercial Managed Care - PPO | Admitting: Cardiology

## 2021-04-29 ENCOUNTER — Other Ambulatory Visit: Payer: Self-pay

## 2021-04-29 ENCOUNTER — Other Ambulatory Visit (HOSPITAL_COMMUNITY)
Admission: RE | Admit: 2021-04-29 | Discharge: 2021-04-29 | Disposition: A | Discharge status: Home / Self Care | Payer: Commercial Managed Care - PPO | Source: Ambulatory Visit | Attending: Cardiology | Admitting: Cardiology

## 2021-04-29 VITALS — BP 120/76 | HR 75 | Ht 74.0 in | Wt 253.0 lb

## 2021-04-29 DIAGNOSIS — I493 Ventricular premature depolarization: Secondary | ICD-10-CM

## 2021-04-29 DIAGNOSIS — G4733 Obstructive sleep apnea (adult) (pediatric): Secondary | ICD-10-CM | POA: Diagnosis not present

## 2021-04-29 DIAGNOSIS — I428 Other cardiomyopathies: Secondary | ICD-10-CM

## 2021-04-29 DIAGNOSIS — Z9989 Dependence on other enabling machines and devices: Secondary | ICD-10-CM | POA: Diagnosis not present

## 2021-04-29 LAB — BASIC METABOLIC PANEL
Anion gap: 6 (ref 5–15)
BUN: 18 mg/dL (ref 6–20)
CO2: 26 mmol/L (ref 22–32)
Calcium: 9.3 mg/dL (ref 8.9–10.3)
Chloride: 106 mmol/L (ref 98–111)
Creatinine, Ser: 0.77 mg/dL (ref 0.61–1.24)
GFR, Estimated: >60 mL/min (ref >60)
Glucose, Bld: 98 mg/dL (ref 70–99)
Potassium: 4 mmol/L (ref 3.5–5.1)
Sodium: 138 mmol/L (ref 135–145)

## 2021-04-29 NOTE — Patient Instructions (Signed)
Medication Instructions:  Your physician recommends that you continue on your current medications as directed. Please refer to the Current Medication list given to you today.  *If you need a refill on your cardiac medications before your next appointment, please call your pharmacy*   Lab Work: BMET today  If you have labs (blood work) drawn today and your tests are completely normal, you will receive your results only by: [place] (if you have MyChart) OR A paper copy in the mail If you have any lab test that is abnormal or we need to change your treatment, we will call you to review the results.   Testing/Procedures: Your physician has requested that you have an echocardiogram IN 6 MONTHS. Echocardiography is a painless test that uses sound waves to create images of your heart. It provides your doctor with information about the size and shape of your heart and how well your heart's chambers and valves are working. This procedure takes approximately one hour. There are no restrictions for this procedure.    Follow-Up: At [facility], you and your health needs are our priority.  As part of our continuing mission to provide you with exceptional heart care, we have created designated Provider Care Teams.  These Care Teams include your primary Cardiologist (physician) and Advanced Practice Providers (APPs -  Physician Assistants and Nurse Practitioners) who all work together to provide you with the care you need, when you need it.  We recommend signing up for the patient portal called "MyChart".  Sign up information is provided on this After Visit Summary.  MyChart is used to connect with patients for Virtual Visits (Telemedicine).  Patients are able to view lab/test results, encounter notes, upcoming appointments, etc.  Non-urgent messages can be sent to your provider as well.   To learn more about what you can do with MyChart, go to [email or web address].    Your next appointment:    6 month(s)  The format for your next appointment:   In Person  Provider:   Rozann Lesches, MD   Other Instructions None

## 2021-09-06 ENCOUNTER — Telehealth: Payer: Self-pay | Admitting: Cardiology

## 2021-09-06 MED ORDER — DAPAGLIFLOZIN PROPANEDIOL 10 MG PO TABS: ORAL_TABLET | ORAL | 11 refills | Status: DC

## 2021-09-06 MED ORDER — CARVEDILOL 25 MG PO TABS: 25.0000 mg | ORAL_TABLET | Freq: Two times a day (BID) | ORAL | 11 refills | Status: DC

## 2021-09-06 MED ORDER — SPIRONOLACTONE 25 MG PO TABS: 25.0000 mg | ORAL_TABLET | Freq: Every day | ORAL | 11 refills | Status: DC

## 2021-09-06 MED ORDER — SACUBITRIL-VALSARTAN 97-103 MG PO TABS: 1.0000 | ORAL_TABLET | Freq: Two times a day (BID) | ORAL | 11 refills | Status: DC

## 2021-09-06 NOTE — Telephone Encounter (Signed)
Refill complete 

## 2021-09-06 NOTE — Telephone Encounter (Signed)
°*  STAT* If patient is at the pharmacy, call can be transferred to refill team.   1. Which medications need to be refilled? (please list name of each medication and dose if known) carvedilol (COREG) 25 MG tablet dapagliflozin propanediol (FARXIGA) 10 MG TABS tablet sacubitril-valsartan (ENTRESTO) 97-103 MG spironolactone (ALDACTONE) 25 MG tablet  2. Which pharmacy/location (including street and city if local pharmacy) is medication to be sent to? [id number] - [place], [place] [address]  3. Do they need a 30 day or 90 day supply? 30 day

## 2021-10-28 ENCOUNTER — Ambulatory Visit (HOSPITAL_COMMUNITY)
Admission: RE | Admit: 2021-10-28 | Discharge: 2021-10-28 | Disposition: A | Discharge status: Home / Self Care | Payer: Commercial Managed Care - PPO | Source: Ambulatory Visit | Attending: Cardiology | Admitting: Cardiology

## 2021-10-28 ENCOUNTER — Telehealth: Payer: Self-pay | Admitting: Cardiology

## 2021-10-28 DIAGNOSIS — I428 Other cardiomyopathies: Secondary | ICD-10-CM | POA: Diagnosis not present

## 2021-10-28 LAB — ECHOCARDIOGRAM COMPLETE
Area-P 1/2: 2.6 cm2
S' Lateral: 4 cm

## 2021-10-28 MED ORDER — SPIRONOLACTONE 25 MG PO TABS: 25.0000 mg | ORAL_TABLET | Freq: Every day | ORAL | 1 refills | Status: DC

## 2021-10-28 MED ORDER — CARVEDILOL 25 MG PO TABS: 25.0000 mg | ORAL_TABLET | Freq: Two times a day (BID) | ORAL | 1 refills | Status: DC

## 2021-10-28 MED ORDER — DAPAGLIFLOZIN PROPANEDIOL 10 MG PO TABS: ORAL_TABLET | ORAL | 1 refills | Status: DC

## 2021-10-28 MED ORDER — SACUBITRIL-VALSARTAN 97-103 MG PO TABS: 1.0000 | ORAL_TABLET | Freq: Two times a day (BID) | ORAL | 1 refills | Status: DC

## 2021-10-28 NOTE — Telephone Encounter (Signed)
? ?  1. Which medications need to be refilled? (please list name of each medication and dose if known) carvedilol (COREG) 25 MG tablet ?dapagliflozin propanediol (FARXIGA) 10 MG TABS tablet ?sacubitril-valsartan (ENTRESTO) 97-103 MG ?spironolactone (ALDACTONE) 25 MG tablet ? ?2. Which pharmacy/location (including street and city if local pharmacy) is medication to be sent to?[facility]    [address]  ? ?3. Do they need a 30 day or 90 day supply? 90 ? 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

## 2021-10-28 NOTE — Progress Notes (Signed)
*  PRELIMINARY RESULTS* ?Echocardiogram ?2D Echocardiogram has been performed. ? ?Samuel Germany ?10/28/2021, 11:50 AM ?

## 2021-10-28 NOTE — Telephone Encounter (Signed)
Refilled per request, pt needs f/u apt ?

## 2021-10-31 ENCOUNTER — Ambulatory Visit: Payer: Commercial Managed Care - PPO

## 2021-10-31 ENCOUNTER — Telehealth: Payer: Self-pay

## 2021-10-31 DIAGNOSIS — I493 Ventricular premature depolarization: Secondary | ICD-10-CM

## 2021-10-31 NOTE — Progress Notes (Unsigned)
Zio 3 day mailed to pt home ?

## 2021-10-31 NOTE — Telephone Encounter (Signed)
Logan Sark, MD  ?10/28/2021  3:50 PM EDT   ?  ?Results reviewed. LVEF now mildly reduced in 45-50% range. Make sure has follow-up pending. Would suggest 72 hour ZIO given history of frequent PVCs to make sure this is not contributing to change in LVEF.  ? ?  ?   ? ? ? ?Zio monitor mailed to pt, apt made for 12/05/21 [facility] office, patient aware. ?

## 2021-10-31 NOTE — Telephone Encounter (Signed)
Zio monitor mailed to pt, apt made for 12/05/21 [facility] office, patient aware ?

## 2021-11-25 ENCOUNTER — Ambulatory Visit
Admission: EM | Admit: 2021-11-25 | Discharge: 2021-11-25 | Disposition: A | Discharge status: Home / Self Care | Payer: Commercial Managed Care - PPO | Attending: Nurse Practitioner | Admitting: Nurse Practitioner

## 2021-11-25 DIAGNOSIS — L02612 Cutaneous abscess of left foot: Secondary | ICD-10-CM | POA: Diagnosis not present

## 2021-11-25 MED ORDER — DOXYCYCLINE HYCLATE 100 MG PO CAPS: 100.0000 mg | ORAL_CAPSULE | Freq: Two times a day (BID) | ORAL | 0 refills | Status: DC

## 2021-11-25 NOTE — Discharge Instructions (Addendum)
Take medication as prescribed. ?Do not pick or disrupt the area while it is healing. ?Wear shoes that do not rub or irritate the area. ?Clean the area twice daily with Dial Gold bar soap.  This is an antibacterial soap which should help with prevention and healing. ?Follow-up if you develop fever, chills, generalized fatigue, or other concerns. ?

## 2021-11-25 NOTE — ED Triage Notes (Signed)
Pt presents with small abscess on left pinky toe that has been intermittent for past few weeks  ?

## 2021-11-25 NOTE — ED Provider Notes (Signed)
?[facility] ? ? ? ?CSN: [id number] ?Arrival date & time: 11/25/21  0840 ? ? ?  ? ?History   ?Chief Complaint ?Chief Complaint  ?Patient presents with  ? Abscess  ? ? ?HPI ?Logan Kennedy is a 56 y.o. male.  ? ?The patient is a 56 year old male who presents with a "abscess" to the left foot.  Area is located at the base of the left small toe.  He states symptoms started approximately 1 month ago, when he noticed that the area was swollen.  He states the area "popped" and got better.  He states subsequently thereafter, it recurred, "popped" again and then scabbed over.  He states he was cleaning the area with alcohol and peroxide.  He states that the area was sore at 1 point, but now it no longer bothers him.  He denies fever, chills, abdominal pain, or general malaise.   ? ?The history is provided by the patient.  ?Abscess ?Location:  Foot ?Foot abscess location:  Top of L foot ?Abscess quality: induration   ?Abscess quality: not draining, no fluctuance, not painful, no redness and no warmth   ?Red streaking: no   ?Progression:  Partially resolved ?Context: not diabetes   ? ?Past Medical History:  ?Diagnosis Date  ? Mild CAD   ? Cardiac catheterization October 2020  ? Nonischemic cardiomyopathy ([place])   ? OSA on CPAP   ? PVC's (premature ventricular contractions)   ? ? ?Patient Active Problem List  ? Diagnosis Date Noted  ? Elevated LFTs   ? Lobar pneumonia ([place]) 05/17/2019  ? Tobacco abuse 05/17/2019  ? Acute systolic CHF (congestive heart failure) ([place]) 05/17/2019  ? Transaminasemia   ? Acute CHF (congestive heart failure) ([place]) 05/16/2019  ? CAP (community acquired pneumonia) 05/16/2019  ? Hypertensive urgency 05/16/2019  ? Liver disease 05/16/2019  ? ? ?Past Surgical History:  ?Procedure Laterality Date  ? APPENDECTOMY    ? COLONOSCOPY N/A 11/14/2014  ? Procedure: COLONOSCOPY;  Surgeon: Aviva Signs Md, MD;  [facility];  Service: Gastroenterology;  Laterality: N/A;  ? RIGHT/LEFT HEART CATH  AND CORONARY ANGIOGRAPHY N/A 05/30/2019  ? Procedure: RIGHT/LEFT HEART CATH AND CORONARY ANGIOGRAPHY;  Surgeon: Jolaine Artist, MD;  Location: [facility];  Service: Cardiovascular;  Laterality: N/A;  ? ? ? ? ? ?Home Medications   ? ?Prior to Admission medications   ?Medication Sig Start Date End Date Taking? Authorizing Provider  ?carvedilol (COREG) 25 MG tablet Take 1 tablet (25 mg total) by mouth 2 (two) times daily with a meal. 10/28/21   Satira Sark, MD  ?dapagliflozin propanediol (FARXIGA) 10 MG TABS tablet TAKE 1 TABLET BY MOUTH ONCE DAILY WITH BREAKFAST. 10/28/21   Satira Sark, MD  ?sacubitril-valsartan (ENTRESTO) 97-103 MG Take 1 tablet by mouth 2 (two) times daily. 10/28/21   Satira Sark, MD  ?sildenafil (VIAGRA) 100 MG tablet Take 1 tablet (100 mg total) by mouth daily as needed for erectile dysfunction. 09/18/20   Bensimhon, Shaune Pascal, MD  ?spironolactone (ALDACTONE) 25 MG tablet Take 1 tablet (25 mg total) by mouth daily. 10/28/21   Satira Sark, MD  ? ? ?Family History ?Family History  ?Problem Relation Age of Onset  ? Heart failure Mother   ? COPD Father   ? ? ?Social History ?Social History  ? ?Tobacco Use  ? Smoking status: Every Day  ?  Packs/day: 1.50  ?  Years: 33.00  ?  Pack years: 49.50  ?  Types: Cigarettes  ? Smokeless tobacco: Never  ?Vaping Use  ? Vaping Use: Never used  ?Substance Use Topics  ? Alcohol use: No  ? Drug use: No  ? ? ? ?Allergies   ?Patient has no known allergies. ? ? ?Review of Systems ?Review of Systems  ?Constitutional: Negative.   ?Respiratory: Negative.    ?Gastrointestinal: Negative.   ?Skin:   ?     Abscess to left small toe  ?Psychiatric/Behavioral: Negative.    ? ? ?Physical Exam ?Triage Vital Signs ?ED Triage Vitals  ?Enc Vitals Group  ?   BP 11/25/21 0912 107/66  ?   Pulse Rate 11/25/21 0912 69  ?   Resp 11/25/21 0912 20  ?   Temp 11/25/21 0912 97.6 ?F (36.4 ?C)  ?   Temp src --   ?   SpO2 11/25/21 0912 96 %  ?   Weight --   ?    Height --   ?   Head Circumference --   ?   Peak Flow --   ?   Pain Score 11/25/21 0910 5  ?   Pain Loc --   ?   Pain Edu? --   ?   Excl. in [place]? --   ? ?No data found. ? ?Updated Vital Signs ?BP 107/66   Pulse 69   Temp 97.6 ?F (36.4 ?C)   Resp 20   SpO2 96%  ? ?Visual Acuity ?Right Eye Distance:   ?Left Eye Distance:   ?Bilateral Distance:   ? ?Right Eye Near:   ?Left Eye Near:    ?Bilateral Near:    ? ?Physical Exam ?Vitals reviewed.  ?Constitutional:   ?   Appearance: Normal appearance.  ?HENT:  ?   Head: Normocephalic and atraumatic.  ?Pulmonary:  ?   Effort: Pulmonary effort is normal.  ?   Breath sounds: Normal breath sounds.  ?Skin: ?   General: Skin is warm and dry.  ?   Findings: Abscess present.  ?   Comments: Healing abscess to base of left small toe. Area is white with peeling skin around the base. No fluctuance, drainage or redness. No warmth to palpation or streaking. Area measures approximately 0.5cm.   ?Neurological:  ?   Mental Status: He is alert.  ? ? ? ?UC Treatments / Results  ?Labs ?(all labs ordered are listed, but only abnormal results are displayed) ?Labs Reviewed - No data to display ? ?EKG ? ? ?Radiology ?No results found. ? ?Procedures ?Procedures (including critical care time) ? ?Medications Ordered in UC ?Medications - No data to display ? ?Initial Impression / Assessment and Plan / UC Course  ?I have reviewed the triage vital signs and the nursing notes. ? ?Pertinent labs & imaging results that were available during my care of the patient were reviewed by me and considered in my medical decision making (see chart for details). ? ?The patient is a 56 year old male who presents with an "abscess to the left foot.  Symptoms have been present for over a month.  The area has drained and returned per the patient's report.  On exam today, the area is not fluctuant, not erythematous, and is not painful.  We will start the patient on doxycycline prophylactically to see if this helps with his  symptoms.  Patient advised that he will need to follow-up with his PCP if symptoms continue to persist. ?Final Clinical Impressions(s) / UC Diagnoses  ? ?Final diagnoses:  ?None  ? ?Discharge Instructions   ?  None ?  ? ?ED Prescriptions   ?None ?  ? ?PDMP not reviewed this encounter. ?  ?Tish Men, NP ?11/25/21 1328 ? ?

## 2021-12-04 ENCOUNTER — Telehealth: Payer: Self-pay

## 2021-12-04 NOTE — Telephone Encounter (Signed)
Pt has follow up appt with Dr. Domenic Polite on 5/4 in [place] for [place] monitor follow up. Spoke with Ailene Ravel, rep for Zio, who stated Monitor was not shipped out, as the order was never completed. [place] office aware.  ?

## 2021-12-05 ENCOUNTER — Ambulatory Visit (INDEPENDENT_AMBULATORY_CARE_PROVIDER_SITE_OTHER): Payer: Commercial Managed Care - PPO

## 2021-12-05 ENCOUNTER — Other Ambulatory Visit: Payer: Self-pay | Admitting: Cardiology

## 2021-12-05 ENCOUNTER — Ambulatory Visit: Payer: Commercial Managed Care - PPO | Admitting: Cardiology

## 2021-12-05 ENCOUNTER — Encounter: Payer: Self-pay | Admitting: Cardiology

## 2021-12-05 VITALS — BP 114/70 | HR 76 | Ht 74.0 in | Wt 253.6 lb

## 2021-12-05 DIAGNOSIS — I428 Other cardiomyopathies: Secondary | ICD-10-CM | POA: Diagnosis not present

## 2021-12-05 DIAGNOSIS — G4733 Obstructive sleep apnea (adult) (pediatric): Secondary | ICD-10-CM

## 2021-12-05 DIAGNOSIS — I493 Ventricular premature depolarization: Secondary | ICD-10-CM

## 2021-12-05 DIAGNOSIS — Z9989 Dependence on other enabling machines and devices: Secondary | ICD-10-CM

## 2021-12-05 NOTE — Patient Instructions (Addendum)
Medication Instructions:  ?Your physician recommends that you continue on your current medications as directed. Please refer to the Current Medication list given to you today. ? ?Labwork: ?none ? ?Testing/Procedures: ?Your physician has recommended that you wear a Zio monitor.  ? ?This monitor is a medical device that records the heart?s electrical activity. Doctors most often use these monitors to diagnose arrhythmias. Arrhythmias are problems with the speed or rhythm of the heartbeat. The monitor is a small device applied to your chest. You can wear one while you do your normal daily activities. While wearing this monitor if you have any symptoms to push the button and record what you felt. Once you have worn this monitor for the period of time provider prescribed (for 3 days), you will return the monitor device in the postage paid box. Once it is returned they will download the data collected and provide [place] with a report which the provider will then review and we will call you with those results. Important tips: ? ?Avoid showering during the first 24 hours of wearing the monitor. ?Avoid excessive sweating to help maximize wear time. ?Do not submerge the device, no hot tubs, and no swimming pools. ?Keep any lotions or oils away from the patch. ?After 24 hours you may shower with the patch on. Take brief showers with your back facing the shower head.  ?Do not remove patch once it has been placed because that will interrupt data and decrease adhesive wear time. ?Push the button when you have any symptoms and write down what you were feeling. ?Once you have completed wearing your monitor, remove and place into box which has postage paid and place in your outgoing mailbox.  ?If for some reason you have misplaced your box then call our office and we can provide another box and/or mail it off for you. ? ?Follow-Up: ?Your physician recommends that you schedule a follow-up appointment in: 6 ,months ? ?Any Other Special  Instructions Will Be Listed Below (If Applicable). ? ?If you need a refill on your cardiac medications before your next appointment, please call your pharmacy. ?

## 2021-12-05 NOTE — Progress Notes (Signed)
? ? ?Cardiology Office Note ? ?Date: 12/05/2021  ? ?ID: Gypsy Balsam, DOB [birth year], MRN [id number] ? ?PCP:  Redmond School, MD  ?Cardiologist:  Rozann Lesches, MD ?Electrophysiologist:  None  ? ?Chief Complaint  ?Patient presents with  ? Cardiac follow-up  ? ? ?History of Present Illness: ?Logan Kennedy is a 56 y.o. male last seen in September 2022.  He is here for follow-up visit.  Reports NYHA class I dyspnea, no chest pain, palpitations, or syncope.  Still drives a truck.  He plans to take a cruise to the [place] with his wife this summer. ? ?Follow-up echocardiogram in March of this year revealed LVEF 45 to 50% range with mild global hypokinesis and grade 1 diastolic dysfunction.  We discussed the results and plan for a Zio patch to investigate PVC burden.  This was to happen previously, but he states he never received it in the mail. ? ?I reviewed his medications which are outlined below and stable from a cardiac perspective. ? ?Past Medical History:  ?Diagnosis Date  ? Mild CAD   ? Cardiac catheterization October 2020  ? Nonischemic cardiomyopathy ([place])   ? OSA on CPAP   ? PVC's (premature ventricular contractions)   ? ? ?Past Surgical History:  ?Procedure Laterality Date  ? APPENDECTOMY    ? COLONOSCOPY N/A 11/14/2014  ? Procedure: COLONOSCOPY;  Surgeon: Aviva Signs Md, MD;  [facility];  Service: Gastroenterology;  Laterality: N/A;  ? RIGHT/LEFT HEART CATH AND CORONARY ANGIOGRAPHY N/A 05/30/2019  ? Procedure: RIGHT/LEFT HEART CATH AND CORONARY ANGIOGRAPHY;  Surgeon: Jolaine Artist, MD;  Location: [facility] LAB;  Service: Cardiovascular;  Laterality: N/A;  ? ? ?Current Outpatient Medications  ?Medication Sig Dispense Refill  ? carvedilol (COREG) 25 MG tablet Take 1 tablet (25 mg total) by mouth 2 (two) times daily with a meal. 90 tablet 1  ? dapagliflozin propanediol (FARXIGA) 10 MG TABS tablet TAKE 1 TABLET BY MOUTH ONCE DAILY WITH BREAKFAST. 90 tablet 1  ?  sacubitril-valsartan (ENTRESTO) 97-103 MG Take 1 tablet by mouth 2 (two) times daily. 180 tablet 1  ? sildenafil (VIAGRA) 100 MG tablet Take 1 tablet (100 mg total) by mouth daily as needed for erectile dysfunction. 6 tablet 1  ? spironolactone (ALDACTONE) 25 MG tablet Take 1 tablet (25 mg total) by mouth daily. 90 tablet 1  ? ?No current facility-administered medications for this visit.  ? ?Allergies:  Patient has no known allergies.  ? ?ROS: No orthopnea or PND. ? ?Physical Exam: ?VS:  BP 114/70   Pulse 76   Ht '6\' 2"'$  (1.88 m)   Wt 253 lb 9.6 oz (115 kg)   SpO2 93%   BMI 32.56 kg/m? , BMI Body mass index is 32.56 kg/m?. ? ?Wt Readings from Last 3 Encounters:  ?12/05/21 253 lb 9.6 oz (115 kg)  ?04/29/21 253 lb (114.8 kg)  ?09/07/20 252 lb (114.3 kg)  ?  ?General: Patient appears comfortable at rest. ?HEENT: Conjunctiva and lids normal. ?Neck: Supple, no elevated JVP or carotid bruits, no thyromegaly. ?Lungs: Clear to auscultation, nonlabored breathing at rest. ?Cardiac: Regular rate and rhythm, no S3 or significant systolic murmur, no pericardial rub. ?Extremities: No pitting edema. ? ?ECG:  An ECG dated 04/29/2021 was personally reviewed today and demonstrated:  Sinus rhythm with PVCs, decreased R wave progression. ? ?Recent Labwork: ?04/29/2021: BUN 18; Creatinine, Ser 0.77; Potassium 4.0; Sodium 138  ? ?Other Studies Reviewed Today: ? ?Echocardiogram 10/28/2021: ? 1. Left  ventricular ejection fraction, by estimation, is 45 to 50%. The  ?left ventricle has mildly decreased function. The left ventricle  ?demonstrates global hypokinesis. The left ventricular internal cavity size  ?was moderately dilated. Left ventricular  ?diastolic parameters are consistent with Grade I diastolic dysfunction  ?(impaired relaxation).  ? 2. Right ventricular systolic function is normal. The right ventricular  ?size is normal.  ? 3. Left atrial size was mildly dilated.  ? 4. The mitral valve is normal in structure. No evidence of  mitral valve  ?regurgitation. No evidence of mitral stenosis.  ? 5. The aortic valve is normal in structure. Aortic valve regurgitation is  ?not visualized. No aortic stenosis is present.  ? 6. The inferior vena cava is normal in size with greater than 50%  ?respiratory variability, suggesting right atrial pressure of 3 mmHg. ? ?Assessment and Plan: ? ?1.  HFmrEF with history of nonischemic cardiomyopathy.  Most recent LVEF 45 to 50% range.  He is clinically stable with NYHA class I dyspnea.  Continue Coreg, Entresto, Aldactone, and [place].  We will check a 72-hour Zio patch to investigate PVC burden and make sure that these are adequately suppressed. ? ?2.  OSA on CPAP.  He reports compliance with ongoing therapy. ? ?3.  History of frequent PVCs. ? ?Medication Adjustments/Labs and Tests Ordered: ?Current medicines are reviewed at length with the patient today.  Concerns regarding medicines are outlined above.  ? ?Tests Ordered: ?No orders of the defined types were placed in this encounter. ? ? ?Medication Changes: ?No orders of the defined types were placed in this encounter. ? ? ?Disposition:  Follow up  6 months. ? ?Signed, ?Satira Sark, MD, [facility] ?12/05/2021 4:02 PM    ?[facility] at [facility] ?[place], [place], [place] [address] ?Phone: [phone]; Fax: [phone]  ?

## 2022-01-24 ENCOUNTER — Other Ambulatory Visit: Payer: Self-pay

## 2022-01-24 ENCOUNTER — Telehealth: Payer: Self-pay | Admitting: Cardiology

## 2022-01-24 MED ORDER — TADALAFIL 10 MG PO TABS: 10.0000 mg | ORAL_TABLET | Freq: Every day | ORAL | 2 refills | Status: DC | PRN

## 2022-06-27 ENCOUNTER — Other Ambulatory Visit: Payer: Self-pay | Admitting: Cardiology

## 2022-06-30 ENCOUNTER — Ambulatory Visit: Payer: Commercial Managed Care - PPO | Attending: Cardiology | Admitting: Cardiology

## 2022-06-30 ENCOUNTER — Encounter: Payer: Self-pay | Admitting: Cardiology

## 2022-06-30 VITALS — BP 128/76 | HR 75 | Ht 74.0 in | Wt 251.6 lb

## 2022-06-30 DIAGNOSIS — Z79899 Other long term (current) drug therapy: Secondary | ICD-10-CM

## 2022-06-30 DIAGNOSIS — I493 Ventricular premature depolarization: Secondary | ICD-10-CM | POA: Diagnosis not present

## 2022-06-30 DIAGNOSIS — I5022 Chronic systolic (congestive) heart failure: Secondary | ICD-10-CM

## 2022-06-30 DIAGNOSIS — I428 Other cardiomyopathies: Secondary | ICD-10-CM | POA: Diagnosis not present

## 2022-06-30 DIAGNOSIS — G4733 Obstructive sleep apnea (adult) (pediatric): Secondary | ICD-10-CM

## 2022-06-30 NOTE — Patient Instructions (Addendum)
Medication Instructions:  Your physician recommends that you continue on your current medications as directed. Please refer to the Current Medication list given to you today.  Labwork: BMET today at [employer] [facility] Dr. Linna Hoff) by Larene Pickett  Testing/Procedures: none  Follow-Up: Your physician recommends that you schedule a follow-up appointment in: 6 months  Any Other Special Instructions Will Be Listed Below (If Applicable).  If you need a refill on your cardiac medications before your next appointment, please call your pharmacy.

## 2022-06-30 NOTE — Progress Notes (Signed)
Cardiology Office Note  Date: 06/30/2022   ID: ARRION BROADDUS, DOB Mar 05, 1966, MRN [id number]  PCP:  Redmond School, MD  Cardiologist:  Rozann Lesches, MD Electrophysiologist:  None   Chief Complaint  Patient presents with   Cardiac follow-up    History of Present Illness: DAXEN LANUM is a 56 y.o. male last seen in May.  He is here for a follow-up visit.  Continues to do well, NYHA class I dyspnea, no chest pain, no palpitations or syncope.  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  I reviewed his medications which are stable from a cardiac perspective and outlined below.  He had an echocardiogram earlier this year at which point LVEF was 45 to 50%.  I personally reviewed his ECG today which shows sinus rhythm with IVCD.  He has not had any recent lab work, we discussed getting a follow-up BMET.  Past Medical History:  Diagnosis Date   Mild CAD    Cardiac catheterization October 2020   Nonischemic cardiomyopathy ([place])    OSA on CPAP    PVC's (premature ventricular contractions)     Past Surgical History:  Procedure Laterality Date   APPENDECTOMY     COLONOSCOPY N/A 11/14/2014   Procedure: COLONOSCOPY;  Surgeon: Aviva Signs Md, MD;  [facility];  Service: Gastroenterology;  Laterality: N/A;   RIGHT/LEFT HEART CATH AND CORONARY ANGIOGRAPHY N/A 05/30/2019   Procedure: RIGHT/LEFT HEART CATH AND CORONARY ANGIOGRAPHY;  Surgeon: Jolaine Artist, MD;  Location: [facility] LAB;  Service: Cardiovascular;  Laterality: N/A;    Current Outpatient Medications  Medication Sig Dispense Refill   carvedilol (COREG) 25 MG tablet Take 1 tablet (25 mg total) by mouth 2 (two) times daily with a meal. 90 tablet 1   dapagliflozin propanediol (FARXIGA) 10 MG TABS tablet TAKE 1 TABLET BY MOUTH ONCE DAILY WITH BREAKFAST. 90 tablet 1   sacubitril-valsartan (ENTRESTO) 97-103 MG Take 1 tablet by mouth  2 (two) times daily. 180 tablet 1   spironolactone (ALDACTONE) 25 MG tablet TAKE 1 TABLET(25 MG) BY MOUTH DAILY 90 tablet 1   tadalafil (CIALIS) 10 MG tablet Take 1 tablet (10 mg total) by mouth daily as needed for erectile dysfunction. 20 tablet 2   No current facility-administered medications for this visit.   Allergies:  Patient has no known allergies.   ROS: No orthopnea or PND.  Physical Exam: VS:  BP 128/76   Pulse 75   Ht '6\' 2"'$  (1.88 m)   Wt 251 lb 9.6 oz (114.1 kg)   SpO2 97%   BMI 32.30 kg/m , BMI Body mass index is 32.3 kg/m.  Wt Readings from Last 3 Encounters:  06/30/22 251 lb 9.6 oz (114.1 kg)  12/05/21 253 lb 9.6 oz (115 kg)  04/29/21 253 lb (114.8 kg)    General: Patient appears comfortable at rest. HEENT: Conjunctiva and lids normal. Neck: Supple, no elevated JVP or carotid bruits. Lungs: Clear to auscultation, nonlabored breathing at rest. Cardiac: Regular rate and rhythm, no S3 or significant systolic murmur. Extremities: No pitting edema.  ECG:  An ECG dated 04/29/2021 was personally reviewed today and demonstrated:  Sinus rhythm with PVCs, decreased R wave progression.  Recent Labwork:  September 2022: Potassium 4.0, BUN 18, creatinine 0.77  Other Studies Reviewed Today:  Echocardiogram 10/28/2021:  1. Left ventricular ejection fraction, by estimation, is 45 to 50%. The  left ventricle has mildly decreased function. The left ventricle  demonstrates global hypokinesis. The left ventricular internal cavity size  was moderately dilated. Left ventricular  diastolic parameters are consistent with Grade I diastolic dysfunction  (impaired relaxation).   2. Right ventricular systolic function is normal. The right ventricular  size is normal.   3. Left atrial size was mildly dilated.   4. The mitral valve is normal in structure. No evidence of mitral valve  regurgitation. No evidence of mitral stenosis.   5. The aortic valve is normal in structure. Aortic  valve regurgitation is  not visualized. No aortic stenosis is present.   6. The inferior vena cava is normal in size with greater than 50%  respiratory variability, suggesting right atrial pressure of 3 mmHg.   Cardiac monitor May 2023: ZIO XT reviewed.  2 days, 20 hours analyzed.  Predominant rhythm is sinus with heart rate ranging from 43 bpm up to 110 bpm and average heart rate 76 bpm.  There were rare PACs representing less than 1% total beats.  Frequent PVCs were noted representing 7.3% total beats with otherwise rare ventricular couplets.  Also episodes of ventricular bigeminy and trigeminy noted.  No sustained arrhythmias or pauses.   Assessment and Plan:  1.  HFmrEF with history of nonischemic cardiomyopathy.  LVEF 45 to 50% range.  Clinically stable with NYHA class I dyspnea on medical therapy.  I reviewed his ECG.  He reports no palpitations or syncope.  Continue Coreg, Entresto, Aldactone, and [place].  Check BMET.  2.  History of frequent PVCs, 7% rhythm burden by cardiac monitor in May.  He is asymptomatic with no history of syncope.  3.  OSA on CPAP.  Reports compliance with treatment.  Medication Adjustments/Labs and Tests Ordered: Current medicines are reviewed at length with the patient today.  Concerns regarding medicines are outlined above.   Tests Ordered: Orders Placed This Encounter  Procedures   Basic metabolic panel    Medication Changes: No orders of the defined types were placed in this encounter.   Disposition:  Follow up  6 months.  Signed, Satira Sark, MD, [facility] 06/30/2022 2:04 PM    [place] at [place], [place], [place] [address] Phone: [phone]; Fax: [phone]

## 2022-07-01 LAB — BASIC METABOLIC PANEL
BUN/Creatinine Ratio: 26 — ABNORMAL HIGH (ref 9–20)
BUN: 21 mg/dL (ref 6–24)
CO2: 24 mmol/L (ref 20–29)
Calcium: 10.2 mg/dL (ref 8.7–10.2)
Chloride: 100 mmol/L (ref 96–106)
Creatinine, Ser: 0.82 mg/dL (ref 0.76–1.27)
Glucose: 76 mg/dL (ref 70–99)
Potassium: 4.9 mmol/L (ref 3.5–5.2)
Sodium: 142 mmol/L (ref 134–144)
eGFR: 102 mL/min/{1.73_m2} (ref >59)

## 2022-08-31 ENCOUNTER — Other Ambulatory Visit: Payer: Self-pay | Admitting: Cardiology

## 2022-09-04 ENCOUNTER — Other Ambulatory Visit: Payer: Self-pay

## 2022-09-04 ENCOUNTER — Other Ambulatory Visit: Payer: Self-pay | Admitting: *Deleted

## 2022-09-04 MED ORDER — CARVEDILOL 25 MG PO TABS: 25.0000 mg | ORAL_TABLET | Freq: Two times a day (BID) | ORAL | 2 refills | Status: DC

## 2022-09-04 MED ORDER — DAPAGLIFLOZIN PROPANEDIOL 10 MG PO TABS: ORAL_TABLET | ORAL | 2 refills | Status: DC

## 2022-09-04 MED ORDER — CARVEDILOL 25 MG PO TABS: 25.0000 mg | ORAL_TABLET | Freq: Two times a day (BID) | ORAL | 3 refills | Status: DC

## 2022-09-04 NOTE — Telephone Encounter (Signed)
90 day refill for coreg sent to [facility]

## 2022-09-05 ENCOUNTER — Telehealth: Payer: Self-pay | Admitting: Cardiology

## 2022-09-05 NOTE — Telephone Encounter (Signed)
Pt c/o medication issue:  1. Name of Medication: dapagliflozin propanediol (FARXIGA) 10 MG TABS tablet   2. How are you currently taking this medication (dosage and times per day)?   3. Are you having a reaction (difficulty breathing--STAT)?   4. What is your medication issue? Patient states that he changed his insurance and this needs to be approved by the doctor and is requesting this so he can get this filled today.  Patient states he is out of this medication and will be going back on the road tomorrow and needs this filled today.

## 2022-09-05 NOTE — Telephone Encounter (Signed)
Detailed message left on vm explaining that refill sent yesterday but PA was needed and has been approved.

## 2022-09-05 NOTE — Telephone Encounter (Signed)
Logan Kennedy (Key: [id number]) PA Case ID #: [id number] Rx #: [id number] Need Help? Call us at [phone] Outcome Approved today 09/05/2022 Your PA request has been approved. Additional information will be provided in the approval communication. (Message 1145) Authorization Expiration Date: 09/04/2025 Drug Farxiga '10MG'$  tablets ePA cloud Child psychotherapist Electronic PA Form [id number] NCPDP) Original Claim Info [phone]

## 2022-10-01 ENCOUNTER — Other Ambulatory Visit: Payer: Self-pay

## 2022-10-01 MED ORDER — SACUBITRIL-VALSARTAN 97-103 MG PO TABS: 1.0000 | ORAL_TABLET | Freq: Two times a day (BID) | ORAL | 1 refills | Status: DC

## 2022-11-03 ENCOUNTER — Other Ambulatory Visit: Payer: Self-pay | Admitting: Cardiology

## 2022-11-04 ENCOUNTER — Other Ambulatory Visit: Payer: Self-pay | Admitting: *Deleted

## 2022-11-04 MED ORDER — DAPAGLIFLOZIN PROPANEDIOL 10 MG PO TABS: ORAL_TABLET | ORAL | 2 refills | Status: DC

## 2022-11-04 MED ORDER — SPIRONOLACTONE 25 MG PO TABS: 25.0000 mg | ORAL_TABLET | Freq: Every day | ORAL | 6 refills | Status: DC

## 2022-11-04 NOTE — Telephone Encounter (Signed)
Followed by dr Domenic Polite

## 2022-12-27 ENCOUNTER — Other Ambulatory Visit: Payer: Self-pay | Admitting: Cardiology

## 2023-01-12 ENCOUNTER — Ambulatory Visit: Payer: 59 | Attending: Cardiology | Admitting: Cardiology

## 2023-01-12 ENCOUNTER — Other Ambulatory Visit: Payer: Self-pay | Admitting: *Deleted

## 2023-01-12 ENCOUNTER — Encounter: Payer: Self-pay | Admitting: Cardiology

## 2023-01-12 VITALS — BP 122/72 | HR 76 | Ht 74.0 in | Wt 251.6 lb

## 2023-01-12 DIAGNOSIS — I428 Other cardiomyopathies: Secondary | ICD-10-CM

## 2023-01-12 DIAGNOSIS — Z79899 Other long term (current) drug therapy: Secondary | ICD-10-CM

## 2023-01-12 DIAGNOSIS — I493 Ventricular premature depolarization: Secondary | ICD-10-CM | POA: Diagnosis not present

## 2023-01-12 NOTE — Patient Instructions (Addendum)
Medication Instructions:  Your physician recommends that you continue on your current medications as directed. Please refer to the Current Medication list given to you today.  Labwork: BMET today at [facility]-fast[facility]  Testing/Procedures: Your physician has requested that you have an echocardiogram. Echocardiography is a painless test that uses sound waves to create images of your heart. It provides your doctor with information about the size and shape of your heart and how well your heart's chambers and valves are working. This procedure takes approximately one hour. There are no restrictions for this procedure. Please do NOT wear cologne, perfume, aftershave, or lotions (deodorant is allowed). Please arrive 15 minutes prior to your appointment time.  Follow-Up: Your physician recommends that you schedule a follow-up appointment in: 6 months  Any Other Special Instructions Will Be Listed Below (If Applicable).  If you need a refill on your cardiac medications before your next appointment, please call your pharmacy.

## 2023-01-12 NOTE — Progress Notes (Signed)
    Cardiology Office Note  Date: 01/12/2023   ID: Logan Kennedy, DOB 12-Dec-1965, MRN [id number]  History of Present Illness: Logan Kennedy is a 57 y.o. male last seen in November 2023.  He is here for a follow-up visit.  Reports stable NYHA class I-II dyspnea, no exertional chest pain, no palpitations or syncope.  I reviewed his medications which are stable from a cardiac perspective.  He reports compliance with therapy.  Also states that he is using CPAP regularly.  He is due for a follow-up echocardiogram for reassessment of LVEF.  He has not undergone interval lab work.  Physical Exam: VS:  BP 122/72   Pulse 76   Ht 6\' 2"  (1.88 m)   Wt 251 lb 9.6 oz (114.1 kg)   SpO2 97%   BMI 32.30 kg/m , BMI Body mass index is 32.3 kg/m.  Wt Readings from Last 3 Encounters:  01/12/23 251 lb 9.6 oz (114.1 kg)  06/30/22 251 lb 9.6 oz (114.1 kg)  12/05/21 253 lb 9.6 oz (115 kg)    General: Patient appears comfortable at rest. HEENT: Conjunctiva and lids normal. Neck: Supple, no elevated JVP or carotid bruits. Lungs: Clear to auscultation, nonlabored breathing at rest. Cardiac: Regular rate and rhythm, no S3 or significant systolic murmur. Extremities: No pitting edema.  ECG:  An ECG dated 06/30/2022 was personally reviewed today and demonstrated:  Sinus rhythm with IVCD.  Labwork: 06/30/2022: BUN 21; Creatinine, Ser 0.82; Potassium 4.9; Sodium 142   Other Studies Reviewed Today:  Echocardiogram 10/28/2021:  1. Left ventricular ejection fraction, by estimation, is 45 to 50%. The  left ventricle has mildly decreased function. The left ventricle  demonstrates global hypokinesis. The left ventricular internal cavity size  was moderately dilated. Left ventricular  diastolic parameters are consistent with Grade I diastolic dysfunction  (impaired relaxation).   2. Right ventricular systolic function is normal. The right ventricular  size is normal.   3. Left atrial size was mildly  dilated.   4. The mitral valve is normal in structure. No evidence of mitral valve  regurgitation. No evidence of mitral stenosis.   5. The aortic valve is normal in structure. Aortic valve regurgitation is  not visualized. No aortic stenosis is present.   6. The inferior vena cava is normal in size with greater than 50%  respiratory variability, suggesting right atrial pressure of 3 mmHg.   Assessment and Plan:  1.  HFmrEF with history of nonischemic cardiomyopathy, LVEF 45 to 50% by echocardiogram in March 2023.  He is clinically stable with no evidence of fluid retention and NYHA class I-II dyspnea.  No palpitations or syncope.  Currently on Coreg, Farxiga, Entresto, and Aldactone.  Will update echocardiogram.  Also discussed walking plan for exercise.  2.  History of frequent PVCs.  Cardiac monitor in May 2023 demonstrated 7% PVC burden.  He is asymptomatic at this time.  No syncope.  Disposition:  Follow up  6 months.  Signed, Jonelle Sidle, M.D., F.A.C.C. [facility] at [facility]

## 2023-01-13 LAB — BASIC METABOLIC PANEL
BUN/Creatinine Ratio: 25 — ABNORMAL HIGH (ref 9–20)
BUN: 19 mg/dL (ref 6–24)
CO2: 20 mmol/L (ref 20–29)
Calcium: 9.2 mg/dL (ref 8.7–10.2)
Chloride: 105 mmol/L (ref 96–106)
Creatinine, Ser: 0.77 mg/dL (ref 0.76–1.27)
Glucose: 84 mg/dL (ref 70–99)
Potassium: 4.3 mmol/L (ref 3.5–5.2)
Sodium: 141 mmol/L (ref 134–144)
eGFR: 103 mL/min/{1.73_m2} (ref >59)

## 2023-02-16 ENCOUNTER — Ambulatory Visit: Payer: 59 | Attending: Cardiology

## 2023-02-16 DIAGNOSIS — I428 Other cardiomyopathies: Secondary | ICD-10-CM

## 2023-02-17 LAB — ECHOCARDIOGRAM COMPLETE
AR max vel: 3.95 cm2
AV Area VTI: 3.75 cm2
AV Area mean vel: 3.6 cm2
AV Mean grad: 5 mmHg
AV Peak grad: 7.6 mmHg
Ao pk vel: 1.38 m/s
Area-P 1/2: 3.4 cm2
Calc EF: 52.5 %
MV VTI: 3.98 cm2
S' Lateral: 4 cm
Single Plane A2C EF: 45.6 %
Single Plane A4C EF: 59.5 %

## 2023-03-27 ENCOUNTER — Other Ambulatory Visit: Payer: Self-pay | Admitting: Cardiology

## 2023-05-30 ENCOUNTER — Other Ambulatory Visit: Payer: Self-pay | Admitting: Cardiology

## 2023-07-24 ENCOUNTER — Encounter: Payer: Self-pay | Admitting: Cardiology

## 2023-07-24 ENCOUNTER — Ambulatory Visit: Payer: 59 | Attending: Cardiology | Admitting: Cardiology

## 2023-07-24 VITALS — BP 122/68 | HR 87 | Ht 73.0 in | Wt 251.2 lb

## 2023-07-24 DIAGNOSIS — I493 Ventricular premature depolarization: Secondary | ICD-10-CM | POA: Diagnosis not present

## 2023-07-24 DIAGNOSIS — I2510 Atherosclerotic heart disease of native coronary artery without angina pectoris: Secondary | ICD-10-CM

## 2023-07-24 DIAGNOSIS — Z79899 Other long term (current) drug therapy: Secondary | ICD-10-CM | POA: Diagnosis not present

## 2023-07-24 DIAGNOSIS — I428 Other cardiomyopathies: Secondary | ICD-10-CM

## 2023-07-24 DIAGNOSIS — I5032 Chronic diastolic (congestive) heart failure: Secondary | ICD-10-CM

## 2023-07-24 NOTE — Patient Instructions (Addendum)
Medication Instructions:  Your physician recommends that you continue on your current medications as directed. Please refer to the Current Medication list given to you today.  Labwork: Your physician recommends that you return for a FASTING lipid profile as soon as possible. Please do not eat or drink for at least 8 hours when you have this done. You may take your medications that morning with a sip of water. [facility] [place]  Testing/Procedures: none  Follow-Up: Your physician recommends that you schedule a follow-up appointment in: 6 months  Any Other Special Instructions Will Be Listed Below (If Applicable).  If you need a refill on your cardiac medications before your next appointment, please call your pharmacy.

## 2023-07-24 NOTE — Progress Notes (Signed)
    Cardiology Office Note  Date: 07/24/2023   ID: Logan Kennedy, DOB 1966-05-01, MRN [id number]  History of Present Illness: Logan Kennedy is a 57 y.o. male last seen in June.  He is here for a routine visit.  Reports NYHA class I-II dyspnea, no exertional chest pain, no palpitations or syncope.  I reviewed his medications.  Current cardiac regimen includes Coreg, Marcelline Deist, Entresto, and Aldactone.  He reports compliance with therapy.  I reviewed his ECG today which shows normal sinus rhythm with borderline IVCD.  Follow-up echocardiogram in July revealed low normal LVEF at 50 to 55%.  Physical Exam: VS:  BP 122/68 (BP Location: Left Arm)   Pulse 87   Ht 6\' 1"  (1.854 m)   Wt 251 lb 3.2 oz (113.9 kg)   SpO2 96%   BMI 33.14 kg/m , BMI Body mass index is 33.14 kg/m.  Wt Readings from Last 3 Encounters:  07/24/23 251 lb 3.2 oz (113.9 kg)  01/12/23 251 lb 9.6 oz (114.1 kg)  06/30/22 251 lb 9.6 oz (114.1 kg)    General: Patient appears comfortable at rest. HEENT: Conjunctiva and lids normal. Neck: Supple, no elevated JVP or carotid bruits. Lungs: Clear to auscultation, nonlabored breathing at rest. Cardiac: Regular rate and rhythm, no S3 or significant systolic murmur, no pericardial rub. Extremities: No pitting edema.  ECG:  An ECG dated 06/30/2022 was personally reviewed today and demonstrated:  Sinus rhythm with IVCD.  Labwork: 01/12/2023: BUN 19; Creatinine, Ser 0.77; Potassium 4.3; Sodium 141   Other Studies Reviewed Today:  Echocardiogram 02/16/2023:  1. Left ventricular ejection fraction, by estimation, is 50 to 55%. The  left ventricle has low normal function. The left ventricle has no regional  wall motion abnormalities. There is mild left ventricular hypertrophy.  Left ventricular diastolic  parameters were normal.   2. Right ventricular systolic function is normal. The right ventricular  size is normal. Tricuspid regurgitation signal is inadequate for  assessing  PA pressure.   3. The mitral valve is normal in structure. No evidence of mitral valve  regurgitation. No evidence of mitral stenosis.   4. The aortic valve is tricuspid. Aortic valve regurgitation is not  visualized. No aortic stenosis is present.   5. The inferior vena cava is normal in size with greater than 50%  respiratory variability, suggesting right atrial pressure of 3 mmHg.   Assessment and Plan:  1.  HFrecEF with history of nonischemic cardiomyopathy, LVEF 50 to 55% by echocardiogram in July.  Symptomatically stable with NYHA class I-II dyspnea.  Continue Coreg, Farxiga, Entresto, and Aldactone.   2.  History of frequent PVCs.  Cardiac monitor in May 2023 demonstrated 7% PVC burden.  He is asymptomatic, no history of syncope.  3.  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  No angina.  Recommend FLP.  Disposition:  Follow up  6 months.  Signed, Jonelle Sidle, M.D., F.A.C.C. [facility] at [facility]

## 2023-08-01 LAB — LIPID PANEL
Chol/HDL Ratio: 4.5 {ratio} (ref 0.0–5.0)
Cholesterol, Total: 130 mg/dL (ref 100–199)
HDL: 29 mg/dL — ABNORMAL LOW (ref >39)
LDL Chol Calc (NIH): 82 mg/dL (ref 0–99)
Triglycerides: 103 mg/dL (ref 0–149)
VLDL Cholesterol Cal: 19 mg/dL (ref 5–40)

## 2023-09-19 ENCOUNTER — Ambulatory Visit
Admission: EM | Admit: 2023-09-19 | Discharge: 2023-09-19 | Disposition: A | Discharge status: Home / Self Care | Payer: 59 | Attending: Nurse Practitioner | Admitting: Nurse Practitioner

## 2023-09-19 ENCOUNTER — Encounter: Payer: Self-pay | Admitting: Emergency Medicine

## 2023-09-19 DIAGNOSIS — H6592 Unspecified nonsuppurative otitis media, left ear: Secondary | ICD-10-CM | POA: Diagnosis not present

## 2023-09-19 DIAGNOSIS — H6591 Unspecified nonsuppurative otitis media, right ear: Secondary | ICD-10-CM

## 2023-09-19 MED ORDER — CETIRIZINE HCL 10 MG PO TABS: 10.0000 mg | ORAL_TABLET | Freq: Every day | ORAL | 0 refills | Status: AC

## 2023-09-19 MED ORDER — FLUTICASONE PROPIONATE 50 MCG/ACT NA SUSP: 2.0000 | Freq: Every day | NASAL | 0 refills | Status: AC

## 2023-09-19 MED ORDER — AMOXICILLIN-POT CLAVULANATE 875-125 MG PO TABS: 1.0000 | ORAL_TABLET | Freq: Two times a day (BID) | ORAL | 0 refills | Status: DC

## 2023-09-19 NOTE — ED Provider Notes (Signed)
 [facility]    CSN: [id number] Arrival date & time: 09/19/23  4098      History   Chief Complaint No chief complaint on file.   HPI Logan Kennedy is a 58 y.o. male.   The history is provided by the patient.   Patient presents for complaints of drainage from his ears that been present for the past month.  Patient states that the left ear is "painful", denies pain from the right ear.  He denies fever, chills, nasal congestion, runny nose, cough, chest pain, abdominal pain, lightheadedness, or dizziness.  He states symptoms have been present for the past month.  States that he has been using peroxide and trying to clean the ears with a Q-tip.  Patient states in the morning when he wakes, he has "yellow" crust in the ears from the drainage.  Past Medical History:  Diagnosis Date   Mild CAD    Cardiac catheterization October 2020   Nonischemic cardiomyopathy (HCC)    OSA on CPAP    PVC's (premature ventricular contractions)     Patient Active Problem List   Diagnosis Date Noted   Elevated LFTs    Lobar pneumonia (HCC) 05/17/2019   Tobacco abuse 05/17/2019   Acute systolic CHF (congestive heart failure) (HCC) 05/17/2019   Transaminasemia    Acute CHF (congestive heart failure) (HCC) 05/16/2019   CAP (community acquired pneumonia) 05/16/2019   Hypertensive urgency 05/16/2019   Liver disease 05/16/2019    Past Surgical History:  Procedure Laterality Date   APPENDECTOMY     COLONOSCOPY N/A 11/14/2014   Procedure: COLONOSCOPY;  Surgeon: Franky Macho Md, MD;  Location: [facility];  Service: Gastroenterology;  Laterality: N/A;   RIGHT/LEFT HEART CATH AND CORONARY ANGIOGRAPHY N/A 05/30/2019   Procedure: RIGHT/LEFT HEART CATH AND CORONARY ANGIOGRAPHY;  Surgeon: Dolores Patty, MD;  Location: [facility];  Service: Cardiovascular;  Laterality: N/A;       Home Medications    Prior to Admission medications   Medication Sig Start Date End Date  Taking? Authorizing Provider  carvedilol (COREG) 25 MG tablet TAKE 1 TABLET(25 MG) BY MOUTH TWICE DAILY WITH A MEAL 12/30/22   Jonelle Sidle, MD  dapagliflozin propanediol (FARXIGA) 10 MG TABS tablet TAKE 1 TABLET BY MOUTH EVERY DAY WITH BREAKFAST 06/01/23   Jonelle Sidle, MD  ENTRESTO 97-103 MG TAKE 1 TABLET BY MOUTH TWICE DAILY 03/27/23   Jonelle Sidle, MD  spironolactone (ALDACTONE) 25 MG tablet Take 1 tablet (25 mg total) by mouth daily. 11/04/22   Jonelle Sidle, MD    Family History Family History  Problem Relation Age of Onset   Heart failure Mother    COPD Father     Social History Social History   Tobacco Use   Smoking status: Every Day    Current packs/day: 1.50    Average packs/day: 1.5 packs/day for 33.0 years (49.5 ttl pk-yrs)    Types: Cigarettes   Smokeless tobacco: Never  Vaping Use   Vaping status: Never Used  Substance Use Topics   Alcohol use: No   Drug use: No     Allergies   Patient has no known allergies.   Review of Systems Review of Systems Per HPI  Physical Exam Triage Vital Signs ED Triage Vitals  Encounter Vitals Group     BP 09/19/23 1018 119/75     Systolic BP Percentile --      Diastolic BP Percentile --  Pulse Rate 09/19/23 1018 72     Resp 09/19/23 1018 18     Temp 09/19/23 1018 (!) 97.4 F (36.3 C)     Temp Source 09/19/23 1018 Oral     SpO2 09/19/23 1018 94 %     Weight --      Height --      Head Circumference --      Peak Flow --      Pain Score 09/19/23 1019 0     Pain Loc --      Pain Education --      Exclude from Growth Chart --    No data found.  Updated Vital Signs BP 119/75 (BP Location: Right Arm)   Pulse 72   Temp (!) 97.4 F (36.3 C) (Oral)   Resp 18   SpO2 94%   Visual Acuity Right Eye Distance:   Left Eye Distance:   Bilateral Distance:    Right Eye Near:   Left Eye Near:    Bilateral Near:     Physical Exam Vitals and nursing note reviewed.  Constitutional:       General: He is not in acute distress.    Appearance: Normal appearance.  HENT:     Head: Normocephalic.     Right Ear: Ear canal and external ear normal.     Left Ear: Ear canal and external ear normal.     Nose: Congestion present.     Right Turbinates: Enlarged and swollen.     Left Turbinates: Enlarged and swollen.     Right Sinus: No maxillary sinus tenderness or frontal sinus tenderness.     Left Sinus: No maxillary sinus tenderness or frontal sinus tenderness.     Mouth/Throat:     Lips: Pink.     Mouth: Mucous membranes are moist.     Pharynx: Oropharynx is clear. Uvula midline. Postnasal drip present. No pharyngeal swelling or posterior oropharyngeal erythema.     Comments: Cobblestoning present to posterior oropharynx  Eyes:     Extraocular Movements: Extraocular movements intact.     Conjunctiva/sclera: Conjunctivae normal.     Pupils: Pupils are equal, round, and reactive to light.  Cardiovascular:     Rate and Rhythm: Normal rate and regular rhythm.     Pulses: Normal pulses.     Heart sounds: Normal heart sounds.  Pulmonary:     Effort: Pulmonary effort is normal. No respiratory distress.     Breath sounds: Normal breath sounds. No stridor. No wheezing, rhonchi or rales.  Abdominal:     General: Bowel sounds are normal.     Palpations: Abdomen is soft.     Tenderness: There is no abdominal tenderness.  Musculoskeletal:     Cervical back: Normal range of motion.  Lymphadenopathy:     Cervical: No cervical adenopathy.  Skin:    General: Skin is warm and dry.  Neurological:     General: No focal deficit present.     Mental Status: He is alert and oriented to person, place, and time.  Psychiatric:        Mood and Affect: Mood normal.        Behavior: Behavior normal.      UC Treatments / Results  Labs (all labs ordered are listed, but only abnormal results are displayed) Labs Reviewed - No data to display  EKG   Radiology No results  found.  Procedures Procedures (including critical care time)  Medications Ordered in UC Medications - No  data to display  Initial Impression / Assessment and Plan / UC Course  I have reviewed the triage vital signs and the nursing notes.  Pertinent labs & imaging results that were available during my care of the patient were reviewed by me and considered in my medical decision making (see chart for details).  On exam, lung sounds are clear throughout, room air sats at 94%.  Patient with erythema and bulging of the left TM, bilateral middle ear effusions present.  Symptoms consistent with possible middle ear effusions.  Will treat otitis media of the left ear with Augmentin 875/125 mg tablets for 7 days.  Will also start fluticasone 50 micro nasal spray and cetirizine 10 mg for bilateral middle ear effusions.  Supportive care recommendations were provided and discussed with the patient to include over-the-counter analgesics, warm compresses to the ears, and to avoid sticking anything inside of the ears while symptoms persist.  Patient advised to follow-up with his PCP if symptoms fail to improve with this treatment.  Patient was in agreement with this plan of care and verbalizes understanding.  All questions were answered.  Patient stable for discharge.   Final Clinical Impressions(s) / UC Diagnoses   Final diagnoses:  None   Discharge Instructions   None    ED Prescriptions   None    PDMP not reviewed this encounter.   Abran Cantor, NP 09/19/23 1044

## 2023-09-19 NOTE — Discharge Instructions (Signed)
 Take medication as prescribed. May take Tylenol for pain, fever, or general discomfort. Warm compresses to the affected ear help with comfort. Do not stick anything inside the ear while symptoms persist. Avoid getting water inside of the ear while symptoms persist. If symptoms fail to improve with this treatment, please follow-up with your PCP for further evaluation. Follow-up as needed.

## 2023-09-19 NOTE — ED Triage Notes (Signed)
 Bilateral ears draining x 1 month.

## 2023-09-24 ENCOUNTER — Other Ambulatory Visit: Payer: Self-pay | Admitting: Cardiology

## 2023-09-28 ENCOUNTER — Other Ambulatory Visit: Payer: Self-pay | Admitting: Cardiology

## 2023-11-26 ENCOUNTER — Other Ambulatory Visit: Payer: Self-pay | Admitting: Cardiology

## 2023-11-27 ENCOUNTER — Telehealth: Payer: Self-pay | Admitting: Cardiology

## 2023-11-27 NOTE — Telephone Encounter (Signed)
 Pt requested for his echo from July 2024 to be printed out. Pt filled out authorization of release and I printed out Echo results and gave to pt.

## 2024-03-31 ENCOUNTER — Other Ambulatory Visit: Payer: Self-pay

## 2024-03-31 ENCOUNTER — Telehealth: Payer: Self-pay | Admitting: Cardiology

## 2024-03-31 ENCOUNTER — Other Ambulatory Visit: Payer: Self-pay | Admitting: Cardiology

## 2024-03-31 MED ORDER — SACUBITRIL-VALSARTAN 97-103 MG PO TABS: 1.0000 | ORAL_TABLET | Freq: Two times a day (BID) | ORAL | 1 refills | Status: DC

## 2024-03-31 NOTE — Telephone Encounter (Signed)
*  STAT* If patient is at the pharmacy, call can be transferred to refill team.   1. Which medications need to be refilled? (please list name of each medication and dose if known) ENTRESTO  97-103 MG   TAKE 1 TABLET BY MOUTH TWICE DAILY    2. Would you like to learn more about the convenience, safety, & potential cost savings by using the [facility]? No     3. Are you open to using the [facility] No  4. Which pharmacy/location (including street and city if local pharmacy) is medication to be sent to?[id number] - [place], [place] - [address]    5. Do they need a 30 day or 90 day supply? 90 Day Supply

## 2024-05-09 ENCOUNTER — Ambulatory Visit: Admitting: Cardiology

## 2024-05-27 ENCOUNTER — Ambulatory Visit: Attending: Cardiology | Admitting: Cardiology

## 2024-05-27 ENCOUNTER — Encounter: Payer: Self-pay | Admitting: Cardiology

## 2024-05-27 VITALS — BP 106/64 | HR 82 | Ht 73.0 in | Wt 249.0 lb

## 2024-05-27 DIAGNOSIS — I5020 Unspecified systolic (congestive) heart failure: Secondary | ICD-10-CM

## 2024-05-27 DIAGNOSIS — I428 Other cardiomyopathies: Secondary | ICD-10-CM | POA: Diagnosis not present

## 2024-05-27 DIAGNOSIS — I493 Ventricular premature depolarization: Secondary | ICD-10-CM

## 2024-05-27 NOTE — Patient Instructions (Signed)
 Medication Instructions:  Your physician recommends that you continue on your current medications as directed. Please refer to the Current Medication list given to you today.   Labwork: None today  Testing/Procedures: None today  Follow-Up: 6 months  Any Other Special Instructions Will Be Listed Below (If Applicable).  If you need a refill on your cardiac medications before your next appointment, please call your pharmacy.

## 2024-05-27 NOTE — Progress Notes (Signed)
    Cardiology Office Note  Date: 05/27/2024   ID: [name], DOB May 07, 1966, MRN [id number]  History of Present Illness: Logan Kennedy is a 58 y.o. male last seen in December 2024.  He is here for a follow-up visit.  Reports NYHA class I dyspnea, no exertional chest pain, no palpitations or syncope.  He has had no significant fluid retention or weight gain.  Thinking about joining a local gym for exercise as well.  He is now driving a truck locally.  I reviewed his medications which are stable from a cardiac perspective.  He does not report any obvious intolerances.  He is in the process of finding a new PCP.  I reviewed his ECG today which shows sinus rhythm with borderline IVCD.  Physical Exam: VS:  BP 106/64 (BP Location: Left Arm, Patient Position: Sitting)   Pulse 82   Ht 6' 1 (1.854 m)   Wt 249 lb (112.9 kg)   SpO2 97%   BMI 32.85 kg/m , BMI Body mass index is 32.85 kg/m.  Wt Readings from Last 3 Encounters:  05/27/24 249 lb (112.9 kg)  07/24/23 251 lb 3.2 oz (113.9 kg)  01/12/23 251 lb 9.6 oz (114.1 kg)    General: Patient appears comfortable at rest. HEENT: Conjunctiva and lids normal. Neck: Supple, no elevated JVP or carotid bruits. Lungs: Clear to auscultation, nonlabored breathing at rest. Cardiac: Regular rate and rhythm, no S3 or significant systolic murmur. Extremities: No pitting edema.  ECG:  An ECG dated 07/24/2023 was personally reviewed today and demonstrated:  Sinus rhythm with borderline IVCD.  Labwork:  December 2024: Cholesterol 130, triglycerides 103, HDL 29, LDL 82  Other Studies Reviewed Today:  No interval cardiac testing for review today.  Assessment and Plan:  1.  HFrecEF with history of nonischemic cardiomyopathy, LVEF 50 to 55% by echocardiogram in July 2024.  Symptomatically stable with NYHA class I dyspnea and no fluid retention.  Agree with initiating a regular exercise plan.  Continue Coreg  25 mg twice daily, Farxiga  10 mg  daily, Entresto  97/103 mg twice daily, and Aldactone  25 mg daily.   2.  History of frequent PVCs.  Cardiac monitor in May 2023 demonstrated 7% PVC burden.  Asymptomatic with no significant palpitations, no syncope.   3.  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  He does not report any angina.  Disposition:  Follow up 6 months.  Signed, Jayson JUDITHANN Sierras, M.D., F.A.C.C. [facility] at [facility]

## 2024-06-23 ENCOUNTER — Other Ambulatory Visit: Payer: Self-pay | Admitting: Cardiology

## 2024-07-01 ENCOUNTER — Other Ambulatory Visit: Payer: Self-pay | Admitting: Cardiology

## 2024-07-04 ENCOUNTER — Telehealth: Payer: Self-pay | Admitting: Cardiology

## 2024-07-05 NOTE — Telephone Encounter (Signed)
 Pt called in about refill on these two. He said he hasn't taken it in a few days, please advise.

## 2024-07-06 MED ORDER — SPIRONOLACTONE 25 MG PO TABS: 25.0000 mg | ORAL_TABLET | Freq: Every day | ORAL | 3 refills | Status: AC

## 2024-07-06 MED ORDER — CARVEDILOL 25 MG PO TABS: 25.0000 mg | ORAL_TABLET | Freq: Two times a day (BID) | ORAL | 3 refills | Status: AC

## 2024-07-06 NOTE — Telephone Encounter (Signed)
 Refills sent

## 2024-09-01 ENCOUNTER — Other Ambulatory Visit: Payer: Self-pay | Admitting: Cardiology

## 2024-09-06 NOTE — Telephone Encounter (Signed)
 In accordance with refill protocols, please review and address the following requirements before this medication refill can be authorized:  Labs

## 2024-09-07 ENCOUNTER — Other Ambulatory Visit: Payer: Self-pay | Admitting: Cardiology

## 2024-09-08 MED ORDER — DAPAGLIFLOZIN PROPANEDIOL 10 MG PO TABS: 10.0000 mg | ORAL_TABLET | Freq: Every day | ORAL | 1 refills | Status: AC
